# Patient Record
Sex: Male | Born: 1940 | Race: Black or African American | Hispanic: No | Smoking: Former smoker
Health system: Southern US, Community
[De-identification: ages and names within clinical notes are randomized; demographics above are authoritative.]

## PROBLEM LIST (undated history)

## (undated) DIAGNOSIS — E119 Type 2 diabetes mellitus without complications: Secondary | ICD-10-CM

## (undated) DIAGNOSIS — I10 Essential (primary) hypertension: Secondary | ICD-10-CM

## (undated) DIAGNOSIS — I499 Cardiac arrhythmia, unspecified: Secondary | ICD-10-CM

## (undated) DIAGNOSIS — I2510 Atherosclerotic heart disease of native coronary artery without angina pectoris: Secondary | ICD-10-CM

## (undated) HISTORY — PX: CARDIAC SURGERY: SHX584

---

## 1993-06-16 HISTORY — PX: CORONARY ANGIOPLASTY WITH STENT PLACEMENT: SHX49

## 2013-08-19 ENCOUNTER — Inpatient Hospital Stay: Payer: Self-pay | Admitting: Cardiology

## 2013-08-19 LAB — APTT: Activated PTT: 27.1 secs (ref 23.6–35.9)

## 2013-08-19 LAB — BASIC METABOLIC PANEL
Anion Gap: 9 (ref 7–16)
BUN: 18 mg/dL (ref 7–18)
Calcium, Total: 9.3 mg/dL (ref 8.5–10.1)
Chloride: 100 mmol/L (ref 98–107)
Co2: 25 mmol/L (ref 21–32)
Creatinine: 1.13 mg/dL (ref 0.60–1.30)
Glucose: 261 mg/dL — ABNORMAL HIGH (ref 65–99)
Osmolality: 279 (ref 275–301)
POTASSIUM: 4.6 mmol/L (ref 3.5–5.1)
Sodium: 134 mmol/L — ABNORMAL LOW (ref 136–145)

## 2013-08-19 LAB — TROPONIN I: TROPONIN-I: 0.31 ng/mL — AB

## 2013-08-19 LAB — CBC WITH DIFFERENTIAL/PLATELET
BASOS PCT: 0.5 %
Basophil #: 0.1 10*3/uL (ref 0.0–0.1)
EOS ABS: 0 10*3/uL (ref 0.0–0.7)
EOS PCT: 0.2 %
HCT: 42.7 % (ref 40.0–52.0)
HGB: 13.7 g/dL (ref 13.0–18.0)
LYMPHS ABS: 0.9 10*3/uL — AB (ref 1.0–3.6)
LYMPHS PCT: 6.7 %
MCH: 27.4 pg (ref 26.0–34.0)
MCHC: 32 g/dL (ref 32.0–36.0)
MCV: 86 fL (ref 80–100)
MONO ABS: 0.5 x10 3/mm (ref 0.2–1.0)
Monocyte %: 3.6 %
NEUTROS PCT: 89 %
Neutrophil #: 12 10*3/uL — ABNORMAL HIGH (ref 1.4–6.5)
Platelet: 263 10*3/uL (ref 150–440)
RBC: 4.99 10*6/uL (ref 4.40–5.90)
RDW: 14.1 % (ref 11.5–14.5)
WBC: 13.5 10*3/uL — ABNORMAL HIGH (ref 3.8–10.6)

## 2013-08-19 LAB — PROTIME-INR
INR: 0.9
Prothrombin Time: 12 secs (ref 11.5–14.7)

## 2013-08-20 LAB — BASIC METABOLIC PANEL
Anion Gap: 8 (ref 7–16)
BUN: 14 mg/dL (ref 7–18)
Calcium, Total: 8.8 mg/dL (ref 8.5–10.1)
Chloride: 101 mmol/L (ref 98–107)
Co2: 27 mmol/L (ref 21–32)
Creatinine: 1.07 mg/dL (ref 0.60–1.30)
EGFR (African American): >60
EGFR (Non-African Amer.): >60
Glucose: 165 mg/dL — ABNORMAL HIGH (ref 65–99)
Osmolality: 276 (ref 275–301)
Potassium: 3.3 mmol/L — ABNORMAL LOW (ref 3.5–5.1)
Sodium: 136 mmol/L (ref 136–145)

## 2013-08-20 LAB — TROPONIN I: Troponin-I: >40 ng/mL

## 2013-08-20 LAB — CK-MB
CK-MB: 78.5 ng/mL — ABNORMAL HIGH (ref 0.5–3.6)
CK-MB: 46.2 ng/mL — ABNORMAL HIGH (ref 0.5–3.6)

## 2013-08-21 LAB — BASIC METABOLIC PANEL
Anion Gap: 5 — ABNORMAL LOW (ref 7–16)
BUN: 14 mg/dL (ref 7–18)
CALCIUM: 8.6 mg/dL (ref 8.5–10.1)
CHLORIDE: 104 mmol/L (ref 98–107)
CREATININE: 1.14 mg/dL (ref 0.60–1.30)
Co2: 28 mmol/L (ref 21–32)
EGFR (Non-African Amer.): >60
Glucose: 137 mg/dL — ABNORMAL HIGH (ref 65–99)
OSMOLALITY: 276 (ref 275–301)
Potassium: 3.2 mmol/L — ABNORMAL LOW (ref 3.5–5.1)
SODIUM: 137 mmol/L (ref 136–145)

## 2014-10-07 NOTE — Discharge Summary (Signed)
PATIENT NAME:  Christopher Alvarado, [name] MR#:  [id number] DATE OF BIRTH:  May 21, 1941  DATE OF ADMISSION:  08/19/2013 DATE OF DISCHARGE:  08/21/2013  PRIMARY CARE PHYSICIAN: No local MD.  CHIEF COMPLAINT: Chest pain.   HISTORY OF PRESENT ILLNESS: Please see admission H and P.   HOSPITAL COURSE: The patient presented to [facility] Emergency Room on 08/19/2013 with 8 out of 10 chest pain. EKG revealed evidence for acute inferior wall ST elevation myocardial infarction. The patient went directly to cardiac catheterization, where coronary arteriography revealed occluded distal right coronary artery. The patient underwent percutaneous coronary intervention, receiving a 2.75 x 15 mm drug-eluting stent with an excellent angiographic result. The patient was returned to the CCU, where he had an uncomplicated hospital stay without recurrent chest pain. The patient ruled in for myocardial infarction by CPK, isoenzymes and troponin. The patient was transferred to telemetry on 08/20/2013. The patient resumed home medications, which include atenolol and lovastatin. The patient was started on lisinopril 5 mg daily. On the morning of 08/21/2013, the patient was ambulating without difficulty and was discharged home in stable condition. He is scheduled to see me in followup in 1 to 2 weeks.   ____________________________ Marcina MillardAlexander Karianne Nogueira, MD ap:jcm D: 08/21/2013 09:19:51 ET T: 08/21/2013 19:15:39 ET JOB#: [id number]  cc: Marcina MillardAlexander Abiola Behring, MD, <Dictator> Marcina MillardALEXANDER Dabney Dever MD ELECTRONICALLY SIGNED 09/06/2013 12:43

## 2014-10-07 NOTE — H&P (Signed)
PATIENT NAME:  Christopher Alvarado, [name] MR#:  [id number] DATE OF BIRTH:  1940-11-08  CARDIOLOGY CONSULT  DATE OF ADMISSION:  08/19/2013  PRIMARY CARE PHYSICIAN: [facility].   REASON FOR ADMISSION: Chest pain, acute inferior ST-elevation myocardial infarction.   HISTORY OF PRESENT ILLNESS: The patient is a 74 year old gentleman with known history of coronary artery disease with prior MI. The patient reports he was in his usual state of health when he developed substernal chest discomfort described as 8/10 at approximately 9:00 a.m. this morning. The patient waited till after 12:00 noon and presented to [facility] Emergency Room where he was still experiencing 6/10 chest pain. EKG was performed which revealed ST elevations in the inferior leads.   PAST MEDICAL HISTORY:  1.  Status post prior MI.  2.  Diabetes.  3.  Hypertension.  4.  Hyperlipidemia.   MEDICATIONS: Lovastatin, atenolol, amlodipine, metformin, and Lantus insulin 50 units at bedtime.   SOCIAL HISTORY: The patient is a widower. His wife recently died. He has 1 child. He has remote tobacco abuse history.   FAMILY HISTORY: Positive for coronary artery disease.   REVIEW OF SYSTEMS:  CONSTITUTIONAL: No fever or chills.  EYES: No blurry vision.  EARS: No hearing loss.  RESPIRATORY: No shortness of breath.  CARDIOVASCULAR: Chest pain as described above.  GASTROINTESTINAL: No nausea, vomiting, or diarrhea.  GENITOURINARY: No dysuria or hematuria.  ENDOCRINE: No polyuria or polydipsia.  MUSCULOSKELETAL: No arthralgias or myalgias.  NEUROLOGICAL: No focal muscle weakness or numbness.  PSYCHOLOGICAL: No depression or anxiety.   PHYSICAL EXAMINATION:  VITAL SIGNS: Blood pressure was 130/80, pulse 60 to 70.  HEENT: Pupils equal and reactive to light and accommodation.  NECK: Supple without thyromegaly.  LUNGS: Clear.  HEART: Normal JVP. Normal PMI. Regular rate and rhythm. Normal S1, S2. No appreciable gallop, murmur, or rub.   ABDOMEN: Soft and nontender. Pulses were intact bilaterally.  MUSCULOSKELETAL: Normal muscle tone.  NEUROLOGIC: Alert and oriented x3. Motor and sensory both grossly intact.   IMPRESSION: A 74 year old gentleman, with known history of coronary artery disease, who presents with prolonged episode of chest pain. EKG diagnostic of acute inferior-ST elevation myocardial infarction.   RECOMMENDATIONS: Proceed to cardiac catheterization and primary PCI. The risks, benefits, and alternatives were explained and informed written consent obtained.   ____________________________ Christopher MillardAlexander Sandi Towe, MD ap:np D: 08/19/2013 15:57:39 ET T: 08/19/2013 16:46:45 ET JOB#: [id number]  cc: Christopher MillardAlexander Kamran Coker, MD, <Dictator> Christopher MillardALEXANDER Jibreel Fedewa MD ELECTRONICALLY SIGNED 09/06/2013 12:42

## 2015-09-02 ENCOUNTER — Ambulatory Visit (INDEPENDENT_AMBULATORY_CARE_PROVIDER_SITE_OTHER)
Admission: EM | Admit: 2015-09-02 | Discharge: 2015-09-02 | Disposition: A | Discharge status: Other Acute Inpatient Hospital | Payer: Medicare Other | Source: Home / Self Care | Attending: Family Medicine | Admitting: Family Medicine

## 2015-09-02 ENCOUNTER — Other Ambulatory Visit: Payer: Self-pay

## 2015-09-02 ENCOUNTER — Encounter: Payer: Self-pay | Admitting: *Deleted

## 2015-09-02 ENCOUNTER — Emergency Department: Payer: Medicare Other

## 2015-09-02 ENCOUNTER — Encounter: Payer: Self-pay | Admitting: Emergency Medicine

## 2015-09-02 ENCOUNTER — Emergency Department
Admission: EM | Admit: 2015-09-02 | Discharge: 2015-09-02 | Disposition: A | Discharge status: Home / Self Care | Payer: Medicare Other | Attending: Emergency Medicine | Admitting: Emergency Medicine

## 2015-09-02 DIAGNOSIS — Z955 Presence of coronary angioplasty implant and graft: Secondary | ICD-10-CM

## 2015-09-02 DIAGNOSIS — R531 Weakness: Secondary | ICD-10-CM | POA: Diagnosis not present

## 2015-09-02 DIAGNOSIS — Z7982 Long term (current) use of aspirin: Secondary | ICD-10-CM

## 2015-09-02 DIAGNOSIS — I2510 Atherosclerotic heart disease of native coronary artery without angina pectoris: Secondary | ICD-10-CM | POA: Insufficient documentation

## 2015-09-02 DIAGNOSIS — E118 Type 2 diabetes mellitus with unspecified complications: Secondary | ICD-10-CM

## 2015-09-02 DIAGNOSIS — I10 Essential (primary) hypertension: Secondary | ICD-10-CM

## 2015-09-02 DIAGNOSIS — Z79899 Other long term (current) drug therapy: Secondary | ICD-10-CM

## 2015-09-02 DIAGNOSIS — M6289 Other specified disorders of muscle: Secondary | ICD-10-CM | POA: Diagnosis not present

## 2015-09-02 DIAGNOSIS — Z87891 Personal history of nicotine dependence: Secondary | ICD-10-CM

## 2015-09-02 DIAGNOSIS — Z7984 Long term (current) use of oral hypoglycemic drugs: Secondary | ICD-10-CM | POA: Insufficient documentation

## 2015-09-02 DIAGNOSIS — Z794 Long term (current) use of insulin: Secondary | ICD-10-CM | POA: Insufficient documentation

## 2015-09-02 DIAGNOSIS — E119 Type 2 diabetes mellitus without complications: Secondary | ICD-10-CM | POA: Insufficient documentation

## 2015-09-02 DIAGNOSIS — Z880 Allergy status to penicillin: Secondary | ICD-10-CM

## 2015-09-02 DIAGNOSIS — R42 Dizziness and giddiness: Secondary | ICD-10-CM | POA: Insufficient documentation

## 2015-09-02 DIAGNOSIS — E1165 Type 2 diabetes mellitus with hyperglycemia: Secondary | ICD-10-CM | POA: Insufficient documentation

## 2015-09-02 HISTORY — DX: Essential (primary) hypertension: I10

## 2015-09-02 HISTORY — DX: Type 2 diabetes mellitus without complications: E11.9

## 2015-09-02 LAB — URINALYSIS COMPLETE WITH MICROSCOPIC (ARMC ONLY)
Bilirubin Urine: NEGATIVE
Glucose, UA: >500 mg/dL — AB
HGB URINE DIPSTICK: NEGATIVE
Ketones, ur: NEGATIVE mg/dL
LEUKOCYTES UA: NEGATIVE
NITRITE: NEGATIVE
PROTEIN: NEGATIVE mg/dL
SPECIFIC GRAVITY, URINE: 1.005 (ref 1.005–1.030)
pH: 5 (ref 5.0–8.0)

## 2015-09-02 LAB — COMPREHENSIVE METABOLIC PANEL
ALBUMIN: 4.1 g/dL (ref 3.5–5.0)
ALK PHOS: 75 U/L (ref 38–126)
ALT: 13 U/L — ABNORMAL LOW (ref 17–63)
ANION GAP: 9 (ref 5–15)
AST: 20 U/L (ref 15–41)
BUN: 13 mg/dL (ref 6–20)
CALCIUM: 9.2 mg/dL (ref 8.9–10.3)
CO2: 24 mmol/L (ref 22–32)
Chloride: 102 mmol/L (ref 101–111)
Creatinine, Ser: 0.93 mg/dL (ref 0.61–1.24)
GFR calc Af Amer: >60 mL/min (ref >60)
GFR calc non Af Amer: >60 mL/min (ref >60)
GLUCOSE: 222 mg/dL — AB (ref 65–99)
Potassium: 3.7 mmol/L (ref 3.5–5.1)
SODIUM: 135 mmol/L (ref 135–145)
Total Bilirubin: 1 mg/dL (ref 0.3–1.2)
Total Protein: 8 g/dL (ref 6.5–8.1)

## 2015-09-02 LAB — CBC
HEMATOCRIT: 40.1 % (ref 40.0–52.0)
HEMOGLOBIN: 13.2 g/dL (ref 13.0–18.0)
MCH: 27.7 pg (ref 26.0–34.0)
MCHC: 32.8 g/dL (ref 32.0–36.0)
MCV: 84.5 fL (ref 80.0–100.0)
Platelets: 270 10*3/uL (ref 150–440)
RBC: 4.74 MIL/uL (ref 4.40–5.90)
RDW: 14.9 % — ABNORMAL HIGH (ref 11.5–14.5)
WBC: 11.6 10*3/uL — AB (ref 3.8–10.6)

## 2015-09-02 LAB — TROPONIN I: Troponin I: <0.03 ng/mL (ref <0.031)

## 2015-09-02 MED ORDER — SODIUM CHLORIDE 0.9 % IV SOLN
1000.0000 mL | Freq: Once | INTRAVENOUS | Status: AC
  Administered 2015-09-02: 1000 mL via INTRAVENOUS

## 2015-09-02 NOTE — ED Notes (Signed)
BG 208

## 2015-09-02 NOTE — ED Provider Notes (Signed)
CSN: [id number]     Arrival date & time 09/02/15  1249 History   None    Chief Complaint  Patient presents with  . 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 Denies any fevers, chills, cough, chest pains, shortness of breath, vision changes, numbness/tingling. States fell off his bed last night but denies hitting his head.   Patient is a 75 y.o. male presenting with dizziness. The history is provided by the patient.  Dizziness   Past Medical History  Diagnosis Date  . Diabetes mellitus without complication (HCC)   . Hypertension    Past Surgical History  Procedure Laterality Date  . Cardiac surgery     No family history on file. Social History  Substance Use Topics  . Smoking status: Former Games developermoker  . Smokeless tobacco: None  . Alcohol Use: No    Review of Systems  Neurological: Positive for dizziness.    Allergies  Penicillins  Home Medications   Prior to Admission medications   Medication Sig Start Date End Date Taking? Authorizing Provider  acetaminophen (ACETAMINOPHEN 8 HOUR) 650 MG CR tablet Take 650 mg by mouth every 8 (eight) hours as needed for pain.   Yes Historical Provider, MD  amLODipine (NORVASC) 10 MG tablet Take 10 mg by mouth daily.   Yes Historical Provider, MD  aspirin 81 MG tablet Take 81 mg by mouth daily.   Yes Historical Provider, MD  carvedilol (COREG) 25 MG tablet Take 12.5 mg by mouth 2 (two) times daily with a meal.   Yes Historical Provider, MD  furosemide (LASIX) 40 MG tablet Take 40 mg by mouth.   Yes Historical Provider, MD  gabapentin (NEURONTIN) 300 MG capsule Take 300 mg by mouth daily.   Yes  Historical Provider, MD  Insulin NPH Isophane & Regular (HUMULIN 70/30 [place]) Inject into the skin.   Yes Historical Provider, MD  losartan (COZAAR) 25 MG tablet Take 25 mg by mouth daily.   Yes Historical Provider, MD  metFORMIN (GLUCOPHAGE) 1000 MG tablet Take 1,000 mg by mouth 2 (two) times daily with a meal.   Yes Historical Provider, MD  potassium chloride SA (K-DUR,KLOR-CON) 20 MEQ tablet Take 20 mEq by mouth 2 (two) times daily.   Yes Historical Provider, MD  ticagrelor (BRILINTA) 90 MG TABS tablet Take 90 mg by mouth 2 (two) times daily.   Yes Historical Provider, MD   Meds Ordered and Administered this Visit  Medications - No data to display  BP 149/70 mmHg  Pulse 85  Temp(Src) 97.9 F (36.6 C) (Oral)  Ht 5\' 11"  (1.803 m)  Wt 240 lb (108.863 kg)  BMI 33.49 kg/m2  SpO2 100% No data found.   Physical Exam  Constitutional: He is oriented to person, place, and time. He appears well-developed and well-nourished. No distress.  HENT:  Head: Normocephalic and atraumatic.  Right Ear: Tympanic membrane, external ear and ear canal normal.  Left Ear: Tympanic membrane, external ear and ear canal normal.  Nose: Nose normal.  Mouth/Throat: Uvula is midline, oropharynx is clear and moist and mucous membranes are normal. No oropharyngeal exudate or tonsillar abscesses.  Eyes: Conjunctivae and EOM are normal. Pupils are equal, round, and reactive to light. Right eye exhibits no discharge. Left eye  exhibits no discharge. No scleral icterus.  Neck: Normal range of motion. Neck supple. No tracheal deviation present. No thyromegaly present.  Cardiovascular: Normal rate, regular rhythm and normal heart sounds.   Pulmonary/Chest: Effort normal and breath sounds normal. No stridor. No respiratory distress. He has no wheezes. He has no rales. He exhibits no tenderness.  Lymphadenopathy:    He has no cervical adenopathy.  Neurological: He is alert and oriented to person, place, and time. He has normal  reflexes. He displays normal reflexes. No cranial nerve deficit.  Mild right upper extremity decreased strength 3-4/5  Skin: Skin is warm and dry. No rash noted. He is not diaphoretic.  Nursing note and vitals reviewed.   ED Course  Procedures (including critical care time)  Labs Review Labs Reviewed  CBG MONITORING, ED    Imaging Review No results found.   Visual Acuity Review  Right Eye Distance:   Left Eye Distance:   Bilateral Distance:    Right Eye Near:   Left Eye Near:    Bilateral Near:        EKG: Normal sinus rhythm; RBBB; inferior infarct, age undetermined; change from previous tracing; reviewed by me and agree   Random blood glucose: 208  MDM   1. Right sided weakness   2. CAD in native artery   3. Type 2 diabetes mellitus with complication, with long-term current use of insulin (HCC)   4. Generalized weakness   5. Dizziness    Discussed with patient and wife, that due to patient's high risk chronic medical problems and current symptoms, would recommend transport to emergency department by EMS for further evaluation and management; patient transported from clinic in stable condition by EMS; charge RN at [facility] ED notified   Payton Mccallum, MD 09/02/15 1352

## 2015-09-02 NOTE — ED Provider Notes (Signed)
[facility] Emergency Department Provider Note  ____________________________________________    I have reviewed the triage vital signs and the nursing notes.   HISTORY  Chief Complaint Weakness    HPI Christopher Alvarado is a 75 y.o. male who presents with complaints of mild dizziness over the last several days. He reports occasionally when he is walking he feels lightheaded. He denies chest pain. No palpitations. No recent travel. No calf pain or swelling. No fevers or chills. He does have a history of diabetes. Patient reports he has been urinating frequently but denies incontinence to me. He reports that sugar was 208 at urgent care. No nausea or vomiting or abdominal pain     Past Medical History  Diagnosis Date  . Diabetes mellitus without complication (HCC)   . Hypertension     There are no active problems to display for this patient.   Past Surgical History  Procedure Laterality Date  . Cardiac surgery      Current Outpatient Rx  Name  Route  Sig  Dispense  Refill  . acetaminophen (ACETAMINOPHEN 8 HOUR) 650 MG CR tablet   Oral   Take 650 mg by mouth every 8 (eight) hours as needed for pain.         Marland Kitchen. amLODipine (NORVASC) 10 MG tablet   Oral   Take 10 mg by mouth daily.         Marland Kitchen. aspirin 81 MG tablet   Oral   Take 81 mg by mouth daily.         . carvedilol (COREG) 25 MG tablet   Oral   Take 12.5 mg by mouth 2 (two) times daily with a meal.         . furosemide (LASIX) 40 MG tablet   Oral   Take 40 mg by mouth.         . gabapentin (NEURONTIN) 300 MG capsule   Oral   Take 300 mg by mouth daily.         . Insulin NPH Isophane & Regular (HUMULIN 70/30 [place])   Subcutaneous   Inject into the skin.         Marland Kitchen. losartan (COZAAR) 25 MG tablet   Oral   Take 25 mg by mouth daily.         . metFORMIN (GLUCOPHAGE) 1000 MG tablet   Oral   Take 1,000 mg by mouth 2 (two) times daily with a meal.         . potassium  chloride SA (K-DUR,KLOR-CON) 20 MEQ tablet   Oral   Take 20 mEq by mouth 2 (two) times daily.         . ticagrelor (BRILINTA) 90 MG TABS tablet   Oral   Take 90 mg by mouth 2 (two) times daily.           Allergies Penicillins  No family history on file.  Social History Social History  Substance Use Topics  . Smoking status: Former Games developermoker  . Smokeless tobacco: None  . Alcohol Use: No    Review of Systems  Constitutional: Negative for fever. Eyes: Negative for redness ENT: Negative for sore throat Cardiovascular: Negative for chest pain Respiratory: Negative for shortness of breath. Gastrointestinal: Negative for abdominal pain Genitourinary: Negative for dysuria. Polyuria Musculoskeletal: Negative for back pain. Skin: Negative for rash. Neurological: Negative for focal weakness. Dizziness as above Psychiatric: no anxiety    ____________________________________________   PHYSICAL EXAM:  VITAL SIGNS: ED Triage  Vitals  Enc Vitals Group     BP 09/02/15 1410 167/80 mmHg     Pulse Rate 09/02/15 1408 76     Resp 09/02/15 1408 18     Temp 09/02/15 1408 98.4 F (36.9 C)     Temp Source 09/02/15 1408 Oral     SpO2 09/02/15 1408 98 %     Weight 09/02/15 1408 240 lb (108.863 kg)     Height 09/02/15 1408  (1.803 m)     Head Cir --      Peak Flow --      Pain Score 09/02/15 1410 0     Pain Loc --      Pain Edu? --      Excl. in GC? --     Constitutional: Alert and oriented. Well appearing and in no distress.  Eyes: Conjunctivae are normal. No erythema or injection ENT   Head: Normocephalic and atraumatic.   Mouth/Throat: Mucous membranes are moist. Cardiovascular: Normal rate, regular rhythm. Normal and symmetric distal pulses are present in the upper extremities.  Respiratory: Normal respiratory effort without tachypnea nor retractions. Breath sounds are clear and equal bilaterally.  Gastrointestinal: Soft and non-tender in all quadrants. No  distention. There is no CVA tenderness. Genitourinary: deferred Musculoskeletal: Nontender with normal range of motion in all extremities. No lower extremity tenderness nor edema. Neurologic:  Normal speech and language. No gross focal neurologic deficits are appreciated. Skin:  Skin is warm, dry and intact. No rash noted. Psychiatric: Mood and affect are normal. Patient exhibits appropriate insight and judgment.  ____________________________________________    LABS (pertinent positives/negatives)  Labs Reviewed  CBC - Abnormal; Notable for the following:    WBC 11.6 (*)    RDW 14.9 (*)    All other components within normal limits  COMPREHENSIVE METABOLIC PANEL - Abnormal; Notable for the following:    Glucose, Bld 222 (*)    ALT 13 (*)    All other components within normal limits  URINALYSIS COMPLETEWITH MICROSCOPIC ([facility] ONLY) - Abnormal; Notable for the following:    Color, Urine COLORLESS (*)    APPearance CLEAR (*)    Glucose, UA >500 (*)    Bacteria, UA RARE (*)    Squamous Epithelial / LPF 0-5 (*)    All other components within normal limits  TROPONIN I    ____________________________________________   EKG  ED ECG REPORT I, Jene Every, the attending physician, personally viewed and interpreted this ECG.   Date: 09/02/2015  EKG Time: 2:10 PM  Rate: 78  Rhythm: normal sinus rhythm  Axis: Normal axis  Intervals:right bundle branch block  ST&T Change: Nonspecific   ____________________________________________    RADIOLOGY  Chest x-ray unremarkable  ____________________________________________   PROCEDURES  Procedure(s) performed: none  Critical Care performed: none  ____________________________________________   INITIAL IMPRESSION / ASSESSMENT AND PLAN / ED COURSE  Pertinent labs & imaging results that were available during my care of the patient were reviewed by me and considered in my medical decision making (see chart for  details).  Patient well-appearing and in no distress. Sent from urgent care for evaluation due to periodic dizziness. History of present illness not consistent with ACS, PE, dissection. Possible dehydration. We'll check labs, chest x-ray give IV fluids and reevaluate.  ____________________________________________   FINAL CLINICAL IMPRESSION(S) / ED DIAGNOSES  Dizziness       Jene Every, MD 09/02/15 1510

## 2015-09-02 NOTE — ED Notes (Signed)
Pt states that he has been weak and dizzy for about 3 days, and frequent urination.

## 2015-09-02 NOTE — Discharge Instructions (Signed)

## 2015-09-02 NOTE — ED Provider Notes (Signed)
Signed out from Dr. Cyril LoosenKinner to reevaluate after 1 L of fluids. Plan also to recheck vital signs. Patient originally sent from an urgent care to evaluate his dizziness. ----------------------------------------- 5:43 PM on 09/02/2015 -----------------------------------------  Physical Exam  BP 151/74 mmHg  Pulse 65  Temp(Src) 98.4 F (36.9 C) (Oral)  Resp 17  Ht 5\' 11"  (1.803 m)  Wt 240 lb (108.863 kg)  BMI 33.49 kg/m2  SpO2 98%  Physical Exam Resting comfortable at this time. No distress. ED Course  Procedures  MDM Patient received 1 L of fluids. No orthostatic changes status post fluid administration. Says still feeling mild dizziness/lightheadedness when standing. No acute findings here in the emergency department. Will follow with primary care doctor.      Myrna Blazeravid Matthew Abran Gavigan, MD 09/02/15 1745

## 2015-09-02 NOTE — ED Notes (Signed)
[facility] for weakness, dizziness, and incontinence for the past 3 days. Patient denies unilateral numbness or weakness.

## 2015-09-03 LAB — GLUCOSE, CAPILLARY: Glucose-Capillary: 208 mg/dL — ABNORMAL HIGH (ref 65–99)

## 2015-09-09 ENCOUNTER — Encounter: Payer: Self-pay | Admitting: Emergency Medicine

## 2015-09-09 ENCOUNTER — Emergency Department
Admission: EM | Admit: 2015-09-09 | Discharge: 2015-09-09 | Disposition: A | Discharge status: Home / Self Care | Payer: Medicare Other | Attending: Emergency Medicine | Admitting: Emergency Medicine

## 2015-09-09 ENCOUNTER — Emergency Department: Payer: Medicare Other

## 2015-09-09 DIAGNOSIS — S065X9A Traumatic subdural hemorrhage with loss of consciousness of unspecified duration, initial encounter: Secondary | ICD-10-CM

## 2015-09-09 DIAGNOSIS — Y939 Activity, unspecified: Secondary | ICD-10-CM | POA: Insufficient documentation

## 2015-09-09 DIAGNOSIS — Z87891 Personal history of nicotine dependence: Secondary | ICD-10-CM | POA: Diagnosis not present

## 2015-09-09 DIAGNOSIS — S80212A Abrasion, left knee, initial encounter: Secondary | ICD-10-CM | POA: Insufficient documentation

## 2015-09-09 DIAGNOSIS — S065X0A Traumatic subdural hemorrhage without loss of consciousness, initial encounter: Secondary | ICD-10-CM | POA: Insufficient documentation

## 2015-09-09 DIAGNOSIS — Z794 Long term (current) use of insulin: Secondary | ICD-10-CM | POA: Insufficient documentation

## 2015-09-09 DIAGNOSIS — N390 Urinary tract infection, site not specified: Secondary | ICD-10-CM

## 2015-09-09 DIAGNOSIS — Y929 Unspecified place or not applicable: Secondary | ICD-10-CM | POA: Insufficient documentation

## 2015-09-09 DIAGNOSIS — S062X0S Diffuse traumatic brain injury without loss of consciousness, sequela: Secondary | ICD-10-CM

## 2015-09-09 DIAGNOSIS — I10 Essential (primary) hypertension: Secondary | ICD-10-CM | POA: Insufficient documentation

## 2015-09-09 DIAGNOSIS — Z7982 Long term (current) use of aspirin: Secondary | ICD-10-CM | POA: Insufficient documentation

## 2015-09-09 DIAGNOSIS — Z7984 Long term (current) use of oral hypoglycemic drugs: Secondary | ICD-10-CM | POA: Diagnosis not present

## 2015-09-09 DIAGNOSIS — E119 Type 2 diabetes mellitus without complications: Secondary | ICD-10-CM | POA: Insufficient documentation

## 2015-09-09 DIAGNOSIS — Y999 Unspecified external cause status: Secondary | ICD-10-CM | POA: Insufficient documentation

## 2015-09-09 DIAGNOSIS — W06XXXA Fall from bed, initial encounter: Secondary | ICD-10-CM | POA: Insufficient documentation

## 2015-09-09 DIAGNOSIS — G939 Disorder of brain, unspecified: Secondary | ICD-10-CM

## 2015-09-09 DIAGNOSIS — S065XAA Traumatic subdural hemorrhage with loss of consciousness status unknown, initial encounter: Secondary | ICD-10-CM

## 2015-09-09 LAB — CBC
HEMATOCRIT: 37.4 % — AB (ref 40.0–52.0)
HEMOGLOBIN: 12.3 g/dL — AB (ref 13.0–18.0)
MCH: 27.8 pg (ref 26.0–34.0)
MCHC: 32.9 g/dL (ref 32.0–36.0)
MCV: 84.3 fL (ref 80.0–100.0)
Platelets: 248 10*3/uL (ref 150–440)
RBC: 4.44 MIL/uL (ref 4.40–5.90)
RDW: 15.2 % — AB (ref 11.5–14.5)
WBC: 8.2 10*3/uL (ref 3.8–10.6)

## 2015-09-09 LAB — URINALYSIS COMPLETE WITH MICROSCOPIC (ARMC ONLY)
BILIRUBIN URINE: NEGATIVE
Glucose, UA: >500 mg/dL — AB
NITRITE: NEGATIVE
PH: 6 (ref 5.0–8.0)
Protein, ur: 100 mg/dL — AB
Specific Gravity, Urine: 1.037 — ABNORMAL HIGH (ref 1.005–1.030)

## 2015-09-09 LAB — BASIC METABOLIC PANEL
ANION GAP: 6 (ref 5–15)
BUN: 13 mg/dL (ref 6–20)
CALCIUM: 8.7 mg/dL — AB (ref 8.9–10.3)
CHLORIDE: 103 mmol/L (ref 101–111)
CO2: 26 mmol/L (ref 22–32)
Creatinine, Ser: 1.11 mg/dL (ref 0.61–1.24)
GFR calc non Af Amer: >60 mL/min (ref >60)
Glucose, Bld: 315 mg/dL — ABNORMAL HIGH (ref 65–99)
Potassium: 3.3 mmol/L — ABNORMAL LOW (ref 3.5–5.1)
SODIUM: 135 mmol/L (ref 135–145)

## 2015-09-09 LAB — PROTIME-INR
INR: 1.04
PROTHROMBIN TIME: 13.8 s (ref 11.4–15.0)

## 2015-09-09 LAB — GLUCOSE, CAPILLARY: GLUCOSE-CAPILLARY: 319 mg/dL — AB (ref 65–99)

## 2015-09-09 LAB — APTT: APTT: 27 s (ref 24–36)

## 2015-09-09 MED ORDER — CIPROFLOXACIN IN D5W 400 MG/200ML IV SOLN
400.0000 mg | Freq: Once | INTRAVENOUS | Status: AC
  Administered 2015-09-09: 400 mg via INTRAVENOUS
  Filled 2015-09-09: qty 200

## 2015-09-09 MED ORDER — SODIUM CHLORIDE 0.9 % IV BOLUS (SEPSIS)
1000.0000 mL | Freq: Once | INTRAVENOUS | Status: AC
  Administered 2015-09-09: 1000 mL via INTRAVENOUS

## 2015-09-09 MED ORDER — LABETALOL HCL 5 MG/ML IV SOLN
10.0000 mg | Freq: Once | INTRAVENOUS | Status: AC
  Administered 2015-09-09: 10 mg via INTRAVENOUS
  Filled 2015-09-09: qty 4

## 2015-09-09 NOTE — ED Notes (Signed)
Patient transported to CT 

## 2015-09-09 NOTE — ED Notes (Signed)
Pt reports mechanical fall getting out of bed. Small abrasion to left shin. Pt reports he did hit head but denies any pain at all or LOC.  Has stopped taking long acting insulin since they switched in from lantus.

## 2015-09-09 NOTE — ED Notes (Signed)
Dr Sharma Covertnorman notified of bp

## 2015-09-09 NOTE — ED Provider Notes (Addendum)
[facility] Emergency Department Provider Note  ____________________________________________  Time seen: Approximately 7:29 AM  I have reviewed the triage vital signs and the nursing notes.   HISTORY  Chief Complaint Fall    HPI Christopher Alvarado is a 75 y.o. male with a history of DM, HTN, presenting with fall. Patient reports that 4 days ago, his primary care physician changed his insulin regimen. 3 days ago he was placed on antibiotics for a UTI. Since his insulin regimen has changed, the patient reports that he has had a sensation of being unsteady when he walks. He has been borrowing his friend's walker. 3 nights ago, he sustained a fall where he slid off the edge of the bed when trying to get up to go to the bathroom in the middle the night. He did not lose consciousness or injure himself, nor did he have any chest pain, shortness of breath, palpitations, lightheadedness. At 5:30 this morning, the same thing happened where he got up and the edge of the bed and just slid off onto the ground resulting in the left knee abrasion but no loss of consciousness, he did not hit his head. He also had no preceding symptoms today. He has not been checking his blood sugars when he feels unsteady on his feet. He reports eating and drinking normally, no fevers or chills.   Past Medical History  Diagnosis Date  . Diabetes mellitus without complication (HCC)   . Hypertension     There are no active problems to display for this patient.   Past Surgical History  Procedure Laterality Date  . Cardiac surgery      Current Outpatient Rx  Name  Route  Sig  Dispense  Refill  . acetaminophen (ACETAMINOPHEN 8 HOUR) 650 MG CR tablet   Oral   Take 650 mg by mouth every 8 (eight) hours as needed for pain.         Marland Kitchen amLODipine (NORVASC) 10 MG tablet   Oral   Take 10 mg by mouth daily.         Marland Kitchen aspirin 81 MG tablet   Oral   Take 81 mg by mouth daily.         .  carvedilol (COREG) 25 MG tablet   Oral   Take 12.5 mg by mouth 2 (two) times daily with a meal.         . furosemide (LASIX) 40 MG tablet   Oral   Take 40 mg by mouth.         . gabapentin (NEURONTIN) 300 MG capsule   Oral   Take 300 mg by mouth daily.         . Insulin NPH Isophane & Regular (HUMULIN 70/30 [place])   Subcutaneous   Inject into the skin.         Marland Kitchen losartan (COZAAR) 25 MG tablet   Oral   Take 25 mg by mouth daily.         . metFORMIN (GLUCOPHAGE) 1000 MG tablet   Oral   Take 1,000 mg by mouth 2 (two) times daily with a meal.         . potassium chloride SA (K-DUR,KLOR-CON) 20 MEQ tablet   Oral   Take 20 mEq by mouth 2 (two) times daily.         . ticagrelor (BRILINTA) 90 MG TABS tablet   Oral   Take 90 mg by mouth 2 (two) times daily.  Allergies Penicillins  History reviewed. No pertinent family history.  Social History Social History  Substance Use Topics  . Smoking status: Former Games developer  . Smokeless tobacco: None  . Alcohol Use: No    Review of Systems Constitutional: No fever/chills. No lightheadedness. No syncope. Eyes: No visual changes. ENT: No sore throat. Cardiovascular: Denies chest pain, palpitations. Respiratory: Denies shortness of breath.  No cough. Gastrointestinal: No abdominal pain.  No nausea, no vomiting.  No diarrhea.  No constipation. Genitourinary: Negative for dysuria. Musculoskeletal: Negative for back pain. Skin: Negative for rash. Positive abrasion to the left knee. Neurological: Negative for headaches, focal weakness or numbness. No tingling. Positive gait and balance. No visual changes, speech changes or changes in mentation.  10-point ROS otherwise negative.  ____________________________________________   PHYSICAL EXAM:  VITAL SIGNS: ED Triage Vitals  Enc Vitals Group     BP 09/09/15 0635 189/82 mmHg     Pulse Rate 09/09/15 0635 76     Resp 09/09/15 0635 16     Temp 09/09/15 0635  98.4 F (36.9 C)     Temp src --      SpO2 09/09/15 0635 96 %     Weight 09/09/15 0635 245 lb (111.131 kg)     Height 09/09/15 0635  (1.803 m)     Head Cir --      Peak Flow --      Pain Score 09/09/15 0638 2     Pain Loc --      Pain Edu? --      Excl. in GC? --     Constitutional: Patient is alert and oriented and answering questions appropriately. He is chronically ill appearing but in no acute distress at this time.  Eyes: Conjunctivae are normal.  EOMI. PERRLA. No scleral icterus. Head: Atraumatic. No raccoon eyes or Battle sign. Nose: No congestion/rhinnorhea. Mouth/Throat: Mucous membranes are moist. No malocclusion or dental injury. Neck: No stridor.  Supple.  Full range of motion without pain. No midline C-spine tenderness to palpation, step-offs or deformities. Cardiovascular: Normal rate, regular rhythm. No murmurs, rubs or gallops.  Respiratory: Normal respiratory effort.  No retractions. Lungs CTAB.  No wheezes, rales or ronchi. Gastrointestinal: Obese, soft, mildly distended. Nontender. No guarding or rebound. No peritoneal signs. Musculoskeletal: Minimal nonpitting lower extremity edema to the mid tibial shaft. No palpable cords or tenderness to palpation in the calves. Full range of motion of the bilateral hips knees and ankles without pain. Neurologic: Alert and oriented 3. Speech is clear.  Face and smile symmetric. Tongue is midline. EOMI and PERRLA. No nystagmus. During pronator drift testing, the patient has 2 cm of upward drift in the left arm 5 out of 5 grip, biceps, triceps, hip flexors, plantar flexion and dorsiflexion. Normal sensation to light touch in the bilateral upper and lower extremities, and face. Normal heel-to-shin.  Skin:  Skin is warm, dry. 1 x 1" circular abrasion over the left knee just inferior to the patella without any knee effusion. Psychiatric: Mood and affect are normal. Speech and behavior are normal.  Normal  judgement.  ____________________________________________   LABS (all labs ordered are listed, but only abnormal results are displayed)  Labs Reviewed  GLUCOSE, CAPILLARY - Abnormal; Notable for the following:    Glucose-Capillary 319 (*)    All other components within normal limits  CBC - Abnormal; Notable for the following:    Hemoglobin 12.3 (*)    HCT 37.4 (*)    RDW 15.2 (*)  All other components within normal limits  BASIC METABOLIC PANEL - Abnormal; Notable for the following:    Potassium 3.3 (*)    Glucose, Bld 315 (*)    Calcium 8.7 (*)    All other components within normal limits  URINALYSIS COMPLETEWITH MICROSCOPIC ([facility] ONLY) - Abnormal; Notable for the following:    Color, Urine YELLOW (*)    APPearance CLEAR (*)    Glucose, UA >500 (*)    Ketones, ur TRACE (*)    Specific Gravity, Urine 1.037 (*)    Hgb urine dipstick 1+ (*)    Protein, ur 100 (*)    Leukocytes, UA TRACE (*)    Bacteria, UA RARE (*)    Squamous Epithelial / LPF 0-5 (*)    All other components within normal limits  URINE CULTURE   ____________________________________________  EKG  ED ECG REPORT I, Rockne MenghiniNorman, Anne-Caroline, the attending physician, personally viewed and interpreted this ECG.   Date: 09/09/2015  EKG Time: 633  Rate: 73  Rhythm: normal sinus rhythm  Axis: Leftward  Intervals: Shortened PR interval  ST&T Change: No ST elevation; Possible incomplete right bundle-branch block. EKG is compared to 3/6 and 3/19 and overall the morphology is grossly unchanged with the exception of that directionality of the QRS in V1.   ____________________________________________  RADIOLOGY  Ct Head Wo Contrast  09/09/2015  CLINICAL DATA:  Pt states weakness and dizziness x 1 week. Pt states that he fell a week ago, pt also states he fell out of bed this am, states he hit his head but denies LOC. Pt denies hx of stroke, seizure, or ca. EXAM: CT HEAD WITHOUT CONTRAST TECHNIQUE: Contiguous  axial images were obtained from the base of the skull through the vertex without intravenous contrast. COMPARISON:  None. FINDINGS: There is a large mixed attenuation extra-axial collection over the RIGHT convexity, greater than 3.3 cm in thickness as seen on image 21 series 2. The more dependent collection is isodense with brain, 40-50 Hounsfield units, and the more superior collection is definitely hypodense but not to the degree of cerebrospinal fluid. No parenchymal hemorrhage. 10 mm of RIGHT-to-LEFT shift. No similar collections on the LEFT. No parenchymal hemorrhage. No skull fracture. No sinus air-fluid level. Generalized atrophy. No hydrocephalus. 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 Significant RIGHT-to-LEFT shift of 1 cm. Neurosurgical evacuation is warranted. Critical Value/emergent results were called by telephone at the time of interpretation on 09/09/2015 at 8:37 am to Dr. Rockne MenghiniANNE-CAROLINE Elizabeht Suto , who verbally acknowledged these results. Electronically Signed   By: Elsie StainJohn T Curnes M.D.   On: 09/09/2015 08:37    ____________________________________________   PROCEDURES  Procedure(s) performed: None  Critical Care performed: Yes ____________________________________________   INITIAL IMPRESSION / ASSESSMENT AND PLAN / ED COURSE  Pertinent labs & imaging results that were available during my care of the patient were reviewed by me and considered in my medical decision making (see chart for details).  75 y.o. male under treatment for urinary tract infection, with new insulin regimen, presenting with 2 episodes of falling from the bed with no reported significant injury, as well as new unsteadiness on his feet. My neurologic exam is grossly normal except for the mild upward drift in the left arm during her nadir testing but the patient has full strength in the left upper extremity. I will get a CT scan to evaluate for stroke although the etiology of  his symptoms is likely to be due to this.  We will also repeat a urine to see if he is responsive to his antibiotic regimen. He is hyperglycemic here and we will get labs to rule out DKA. I will also give him some IV fluids and we will test his gait when we have more people to help as he is morbidly obese and we will keep him safe.  ----------------------------------------- 9:18 AM on 09/09/2015 -----------------------------------------  The patient has a CT scan that shows a chronic subdural on the right side with a subacute component that is 3 cm with 2 cm of midline shift. I went back to reevaluate the patient who continues to be stable and states that he is not having a headache or any new symptoms at this time. He denies any falls other than the 2 falls described in my history of present illness. I have contacted the [facility] transfer center and spoken with Dr. Samuella Cota, neurosurgeon on-call, who recommends ER to ER transfer. I am awaiting a call from the ER physician. At this time, the patient is not having any severe neurologic deficit nor any seizure so antiepileptics are not indicated. He does have a history of taking aspirin but I would not be able to get platelets here and he is not having any acute bleeding.  We will continue to monitor him closely while he is in the emergency department here.  The patient also still has some leukocyte esterase and white blood cells in his urine to have treated him with Rocephin.  CRITICAL CARE Performed by: Rockne Menghini   Total critical care time: 35 minutes  Critical care time was exclusive of separately billable procedures and treating other patients.  Critical care was necessary to treat or prevent imminent or life-threatening deterioration.  Critical care was time spent personally by me on the following activities: development of treatment plan with patient and/or surrogate as well as nursing, discussions with consultants, evaluation of patient's  response to treatment, examination of patient, obtaining history from patient or surrogate, ordering and performing treatments and interventions, ordering and review of laboratory studies, ordering and review of radiographic studies, pulse oximetry and re-evaluation of patient's condition.   ____________________________________________  FINAL CLINICAL IMPRESSION(S) / ED DIAGNOSES  Final diagnoses:  Subdural hematoma (HCC)  Midline shift of brain due to hematoma  UTI (lower urinary tract infection)      NEW MEDICATIONS STARTED DURING THIS VISIT:  New Prescriptions   No medications on file     Rockne Menghini, MD 09/09/15 1610  Rockne Menghini, MD 09/09/15 [phone]

## 2015-09-09 NOTE — ED Notes (Signed)
Pt given portable phone to call family 

## 2015-09-09 NOTE — ED Notes (Signed)
Pt. Slid out of bed this morning at around 5:30.  Pt. Has a small abrasion on lt. Knee.  Pt. States he hit head on end table.  No visible laceration noted, pt. Denies LOC.  Pt. States he stopped taking insulin, because pt. States "It caused my UTI"  Pt. Currently being treated for uti.  Pt. States he is taking his metformin.

## 2015-09-09 NOTE — ED Notes (Signed)
Report to [facility]. [facility]

## 2015-09-10 LAB — URINE CULTURE

## 2016-04-26 ENCOUNTER — Emergency Department: Payer: Medicare Other

## 2016-04-26 ENCOUNTER — Emergency Department
Admission: EM | Admit: 2016-04-26 | Discharge: 2016-04-26 | Payer: Medicare Other | Attending: Emergency Medicine | Admitting: Emergency Medicine

## 2016-04-26 ENCOUNTER — Encounter: Payer: Self-pay | Admitting: Emergency Medicine

## 2016-04-26 DIAGNOSIS — Z955 Presence of coronary angioplasty implant and graft: Secondary | ICD-10-CM | POA: Diagnosis not present

## 2016-04-26 DIAGNOSIS — Z79899 Other long term (current) drug therapy: Secondary | ICD-10-CM | POA: Diagnosis not present

## 2016-04-26 DIAGNOSIS — Z794 Long term (current) use of insulin: Secondary | ICD-10-CM | POA: Insufficient documentation

## 2016-04-26 DIAGNOSIS — Z87891 Personal history of nicotine dependence: Secondary | ICD-10-CM | POA: Insufficient documentation

## 2016-04-26 DIAGNOSIS — Z7982 Long term (current) use of aspirin: Secondary | ICD-10-CM | POA: Insufficient documentation

## 2016-04-26 DIAGNOSIS — I10 Essential (primary) hypertension: Secondary | ICD-10-CM | POA: Insufficient documentation

## 2016-04-26 DIAGNOSIS — Z7984 Long term (current) use of oral hypoglycemic drugs: Secondary | ICD-10-CM | POA: Insufficient documentation

## 2016-04-26 DIAGNOSIS — R4182 Altered mental status, unspecified: Secondary | ICD-10-CM | POA: Diagnosis present

## 2016-04-26 DIAGNOSIS — E119 Type 2 diabetes mellitus without complications: Secondary | ICD-10-CM | POA: Diagnosis not present

## 2016-04-26 DIAGNOSIS — I639 Cerebral infarction, unspecified: Secondary | ICD-10-CM | POA: Diagnosis not present

## 2016-04-26 HISTORY — DX: Cardiac arrhythmia, unspecified: I49.9

## 2016-04-26 LAB — URINALYSIS COMPLETE WITH MICROSCOPIC (ARMC ONLY)
BACTERIA UA: NONE SEEN
BILIRUBIN URINE: NEGATIVE
GLUCOSE, UA: NEGATIVE mg/dL
Leukocytes, UA: NEGATIVE
Nitrite: NEGATIVE
PH: 5 (ref 5.0–8.0)
Protein, ur: 100 mg/dL — AB
Specific Gravity, Urine: 1.018 (ref 1.005–1.030)

## 2016-04-26 LAB — COMPREHENSIVE METABOLIC PANEL
ALBUMIN: 4 g/dL (ref 3.5–5.0)
ALK PHOS: 69 U/L (ref 38–126)
ALT: 17 U/L (ref 17–63)
AST: 24 U/L (ref 15–41)
Anion gap: 9 (ref 5–15)
BILIRUBIN TOTAL: 0.9 mg/dL (ref 0.3–1.2)
BUN: 17 mg/dL (ref 6–20)
CALCIUM: 9.5 mg/dL (ref 8.9–10.3)
CO2: 24 mmol/L (ref 22–32)
CREATININE: 1.16 mg/dL (ref 0.61–1.24)
Chloride: 105 mmol/L (ref 101–111)
GFR calc Af Amer: >60 mL/min (ref >60)
GFR calc non Af Amer: 60 mL/min — ABNORMAL LOW (ref >60)
GLUCOSE: 78 mg/dL (ref 65–99)
Potassium: 3.6 mmol/L (ref 3.5–5.1)
Sodium: 138 mmol/L (ref 135–145)
TOTAL PROTEIN: 7.5 g/dL (ref 6.5–8.1)

## 2016-04-26 LAB — CBC
HCT: 37.5 % — ABNORMAL LOW (ref 40.0–52.0)
Hemoglobin: 12.2 g/dL — ABNORMAL LOW (ref 13.0–18.0)
MCH: 27.5 pg (ref 26.0–34.0)
MCHC: 32.6 g/dL (ref 32.0–36.0)
MCV: 84.4 fL (ref 80.0–100.0)
Platelets: 218 10*3/uL (ref 150–440)
RBC: 4.44 MIL/uL (ref 4.40–5.90)
RDW: 17.9 % — AB (ref 11.5–14.5)
WBC: 9.9 10*3/uL (ref 3.8–10.6)

## 2016-04-26 LAB — LACTIC ACID, PLASMA: LACTIC ACID, VENOUS: 1.8 mmol/L (ref 0.5–1.9)

## 2016-04-26 LAB — TROPONIN I

## 2016-04-26 LAB — BRAIN NATRIURETIC PEPTIDE: B NATRIURETIC PEPTIDE 5: 435 pg/mL — AB (ref 0.0–100.0)

## 2016-04-26 MED ORDER — DILTIAZEM HCL 25 MG/5ML IV SOLN
10.0000 mg | Freq: Once | INTRAVENOUS | Status: AC
  Administered 2016-04-26: 10 mg via INTRAVENOUS
  Filled 2016-04-26: qty 5

## 2016-04-26 NOTE — ED Provider Notes (Addendum)
[facility] Emergency Department Provider Note   L5 caveat: Review of systems and history is limited by altered mental status.     Time seen: ----------------------------------------- 3:31 PM on 04/26/2016 -----------------------------------------    I have reviewed the triage vital signs and the nursing notes.   HISTORY  Chief Complaint Altered Mental Status    HPI Christopher Alvarado is a 75 y.o. male who presents to ER being brought from home by EMS. Neighbor called 911 stating he was not acting normally. Patient turned the bathroom sink on and forgot it was on. Patient was disoriented on arrival, states he has had a cough but otherwise denies complaints at this time.   Past Medical History:  Diagnosis Date  . Diabetes mellitus without complication (HCC)   . Hypertension   . Irregular heartbeat     There are no active problems to display for this patient.   Past Surgical History:  Procedure Laterality Date  . CARDIAC SURGERY    . CORONARY ANGIOPLASTY WITH STENT PLACEMENT  1995    Allergies Penicillins  Social History Social History  Substance Use Topics  . Smoking status: Former Games developermoker  . Smokeless tobacco: Never Used  . Alcohol use No    Review of Systems Positive for cough, otherwise unknown ____________________________________________   PHYSICAL EXAM:  VITAL SIGNS: ED Triage Vitals  Enc Vitals Group     BP 04/26/16 1426 138/81     Pulse Rate 04/26/16 1426 96     Resp 04/26/16 1426 (!) 21     Temp 04/26/16 1426 97.7 F (36.5 C)     Temp Source 04/26/16 1426 Oral     SpO2 04/26/16 1426 95 %     Weight 04/26/16 1427 230 lb (104.3 kg)     Height 04/26/16 1427 5\' 11"  (1.803 m)     Head Circumference --      Peak Flow --      Pain Score --      Pain Loc --      Pain Edu? --      Excl. in GC? --     Constitutional: Alert But disoriented, no distress Eyes: Conjunctivae are normal.  Normal extraocular  movements. ENT   Head: Normocephalic and atraumatic.   Nose: No congestion/rhinnorhea.   Mouth/Throat: Mucous membranes are moist, patient is holding mucus and secretions in and around the mouth   Neck: No stridor. Cardiovascular: Rapid rate, irregular rhythm. No murmurs, rubs, or gallops. Respiratory: Normal respiratory effort without tachypnea nor retractions. Breath sounds are clear and equal bilaterally. No wheezes/rales/rhonchi. Gastrointestinal: Soft and nontender. Normal bowel sounds Musculoskeletal: Nontender with normal range of motion in all extremities. Bilateral lower extremity edema Neurologic:  Weakness, left facial droop is noted, decreased sensation left arm Skin:  Skin is warm, dry and intact. No rash noted. Psychiatric: Mood and affect are normal. Speech and behavior are normal.  ____________________________________________  EKG: Interpreted by me. Atrial fibrillation with a rate of 122 bpm, wide QRS, long QT, right bundle branch block.  ____________________________________________  ED COURSE:  Pertinent labs & imaging results that were available during my care of the patient were reviewed by me and considered in my medical decision making (see chart for details). Clinical Course   Patient presents to the ER for altered mental status. We will assess with labs and imaging.  Procedures ____________________________________________   LABS (pertinent positives/negatives)  Labs Reviewed  COMPREHENSIVE METABOLIC PANEL - Abnormal; Notable for the following:  Result Value   GFR calc non Af Amer 60 (*)    All other components within normal limits  CBC - Abnormal; Notable for the following:    Hemoglobin 12.2 (*)    HCT 37.5 (*)    RDW 17.9 (*)    All other components within normal limits  TROPONIN I  LACTIC ACID, PLASMA  URINALYSIS COMPLETEWITH MICROSCOPIC ([facility] ONLY)   CRITICAL CARE Performed by: Emily FilbertWilliams, Quamere Mussell E   Total critical care  time: 30 minutes  Critical care time was exclusive of separately billable procedures and treating other patients.  Critical care was necessary to treat or prevent imminent or life-threatening deterioration.  Critical care was time spent personally by me on the following activities: development of treatment plan with patient and/or surrogate as well as nursing, discussions with consultants, evaluation of patient's response to treatment, examination of patient, obtaining history from patient or surrogate, ordering and performing treatments and interventions, ordering and review of laboratory studies, ordering and review of radiographic studies, pulse oximetry and re-evaluation of patient's condition.  RADIOLOGY Images were viewed by me  Chest x-ray CT head IMPRESSION: 1. The right subdural hematoma seen previously has been largely evacuated. A small remaining subdural hematoma is seen anteriorly measuring up to 5 mm in thickness. There is some mild increased attenuation within this small remaining component suggesting a small component of acute subdural blood products. 2. Right parietal infarct, new in the interval, thought to be acute or subacute. 3. Left occipital infarct, new in the interval. A small amount of higher attenuation centrally within the left occipital infarct could represent a small amount of remaining cortex versus some subtle blood products. An MRI could further assess as clinically warranted. IMPRESSION: Bibasilar airspace disease greater on the RIGHT is concerning for multifocal pneumonia versus pulmonary edema.   The findings were called to Dr. Daryel NovemberJonathan Sahory Nordling. ____________________________________________  FINAL ASSESSMENT AND PLAN  Altered mental status, CVA  Plan: Patient with labs and imaging as dictated above. Patient presents to the ER for altered mental status. 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 [facility] this year. I have repeatedly discussed the need for him to stay in the hospital for further evaluation, to have an MRI and thorough workup. For hours the patient declined admission and transfer, subsequently family and friends came and reiterated his need for admission. Patient reluctantly agreed to hospitalization.   Emily FilbertWilliams, Gracelin Weisberg E, MD   Note: This dictation was prepared with Dragon dictation. Any transcriptional errors that result from this process are unintentional    Emily FilbertJonathan E Kaleyah Labreck, MD 04/26/16 1710    Emily FilbertJonathan E Malahki Gasaway, MD 04/26/16 1715    Emily FilbertJonathan E Bodey Frizell, MD 04/26/16 1845    Emily FilbertJonathan E Lundynn Cohoon, MD 04/26/16 2046    Emily FilbertJonathan E Romeo Zielinski, MD 04/26/16 2100    Emily FilbertJonathan E Angelee Bahr, MD 04/26/16 2239

## 2016-04-26 NOTE — ED Notes (Signed)
Friend arrived to pt to go home [facility]

## 2016-04-26 NOTE — ED Notes (Signed)
Pt accepted at [facility]. Awaiting transport

## 2016-04-26 NOTE — ED Notes (Signed)
Pt decided he would allow us to transfer him to [facility] for neuro.

## 2016-04-26 NOTE — ED Notes (Signed)
[facility] transfer center was called to initiate transfer for Neurosurgery per Dr. Mayford KnifeWilliams request @ [phone].  I spoke with Drue StagerMoe at the transfer center.

## 2016-04-26 NOTE — ED Triage Notes (Signed)
Pt presents to ED via [facility] from home c/o [facility]. Pt's neighbor called 911 stating pt was acting abnormally (e.g. turning bathroom sink on and forgetting it's on). Hx DM and HTN, CBG 101 per EMS. Pt A&Ox3, disoriented to situation. States EMS brought him to ED for irregular heartbeat.

## 2016-04-26 NOTE — ED Notes (Signed)
Spoke, with pt's permission, to Christopher Alvarado (his friend) and let her know he was leaving [facility]. Advised we would be sending him by ambulance and wanted to make sure he had someone there to let him in the house. The friend states they will come and pick him up and have the ability to get him into the house. Pt made aware and is agreeable.

## 2016-04-26 NOTE — ED Notes (Signed)
[facility] on scene for transport to [facility]

## 2016-04-26 NOTE — ED Notes (Signed)
Patient accepted to [facility], going to room 2733, [facility] has been called for transport at 2209.

## 2016-05-10 ENCOUNTER — Emergency Department
Admission: EM | Admit: 2016-05-10 | Discharge: 2016-05-10 | Payer: Medicare Other | Attending: Emergency Medicine | Admitting: Emergency Medicine

## 2016-05-10 DIAGNOSIS — Z434 Encounter for attention to other artificial openings of digestive tract: Secondary | ICD-10-CM

## 2016-05-10 DIAGNOSIS — Z955 Presence of coronary angioplasty implant and graft: Secondary | ICD-10-CM | POA: Insufficient documentation

## 2016-05-10 DIAGNOSIS — E119 Type 2 diabetes mellitus without complications: Secondary | ICD-10-CM | POA: Insufficient documentation

## 2016-05-10 DIAGNOSIS — Z794 Long term (current) use of insulin: Secondary | ICD-10-CM | POA: Diagnosis not present

## 2016-05-10 DIAGNOSIS — Z8673 Personal history of transient ischemic attack (TIA), and cerebral infarction without residual deficits: Secondary | ICD-10-CM | POA: Diagnosis not present

## 2016-05-10 DIAGNOSIS — I482 Chronic atrial fibrillation, unspecified: Secondary | ICD-10-CM

## 2016-05-10 DIAGNOSIS — Z7982 Long term (current) use of aspirin: Secondary | ICD-10-CM | POA: Insufficient documentation

## 2016-05-10 DIAGNOSIS — K9423 Gastrostomy malfunction: Secondary | ICD-10-CM | POA: Insufficient documentation

## 2016-05-10 DIAGNOSIS — Z87891 Personal history of nicotine dependence: Secondary | ICD-10-CM | POA: Insufficient documentation

## 2016-05-10 DIAGNOSIS — I10 Essential (primary) hypertension: Secondary | ICD-10-CM | POA: Insufficient documentation

## 2016-05-10 DIAGNOSIS — Z79899 Other long term (current) drug therapy: Secondary | ICD-10-CM | POA: Insufficient documentation

## 2016-05-10 DIAGNOSIS — T85528A Displacement of other gastrointestinal prosthetic devices, implants and grafts, initial encounter: Secondary | ICD-10-CM

## 2016-05-10 LAB — CBC WITH DIFFERENTIAL/PLATELET
BASOS ABS: 0 10*3/uL (ref 0–0.1)
Basophils Relative: 0 %
EOS PCT: 2 %
Eosinophils Absolute: 0.3 10*3/uL (ref 0–0.7)
HEMATOCRIT: 41.5 % (ref 40.0–52.0)
Hemoglobin: 13.4 g/dL (ref 13.0–18.0)
LYMPHS ABS: 1.3 10*3/uL (ref 1.0–3.6)
LYMPHS PCT: 9 %
MCH: 27.2 pg (ref 26.0–34.0)
MCHC: 32.4 g/dL (ref 32.0–36.0)
MCV: 84.1 fL (ref 80.0–100.0)
MONO ABS: 1 10*3/uL (ref 0.2–1.0)
MONOS PCT: 6 %
NEUTROS ABS: 12.6 10*3/uL — AB (ref 1.4–6.5)
Neutrophils Relative %: 83 %
PLATELETS: 243 10*3/uL (ref 150–440)
RBC: 4.94 MIL/uL (ref 4.40–5.90)
RDW: 16.6 % — AB (ref 11.5–14.5)
WBC: 15.2 10*3/uL — ABNORMAL HIGH (ref 3.8–10.6)

## 2016-05-10 LAB — BASIC METABOLIC PANEL
Anion gap: 6 (ref 5–15)
BUN: 27 mg/dL — AB (ref 6–20)
CHLORIDE: 112 mmol/L — AB (ref 101–111)
CO2: 28 mmol/L (ref 22–32)
Calcium: 9.1 mg/dL (ref 8.9–10.3)
Creatinine, Ser: 1.1 mg/dL (ref 0.61–1.24)
GFR calc Af Amer: >60 mL/min (ref >60)
GFR calc non Af Amer: >60 mL/min (ref >60)
Glucose, Bld: 148 mg/dL — ABNORMAL HIGH (ref 65–99)
POTASSIUM: 3.8 mmol/L (ref 3.5–5.1)
SODIUM: 146 mmol/L — AB (ref 135–145)

## 2016-05-10 LAB — PROTIME-INR
INR: 1.4
Prothrombin Time: 17.3 seconds — ABNORMAL HIGH (ref 11.4–15.2)

## 2016-05-10 LAB — TROPONIN I

## 2016-05-10 MED ORDER — DILTIAZEM HCL 25 MG/5ML IV SOLN
10.0000 mg | Freq: Once | INTRAVENOUS | Status: AC
  Administered 2016-05-10: 10 mg via INTRAVENOUS
  Filled 2016-05-10: qty 5

## 2016-05-10 NOTE — ED Provider Notes (Signed)
[facility] Emergency Department Provider Note   ____________________________________________   First MD Initiated Contact with Patient 05/10/16 0532     (approximate)  I have reviewed the triage vital signs and the nursing notes.   HISTORY  Chief Complaint feeding tube displacement  History limited by prior CVA  HPI Christopher Alvarado is a 75 y.o. male brought to the ED from SNF with a chief complaint of dislodged G-tube. Patient is unable to give history secondary to recent CVA. Recent history obtained via patient's medical records via Care Everywhere. [facility] week hospitalization for ischemic stroke. History of atrial fibrillation on warfarin. Records reveal patient had endoscopic placement of G-tube on 11/21. Nursing staff found tube dislodged this morning when they rounded on patient.   Past Medical History:  Diagnosis Date  . Diabetes mellitus without complication (HCC)   . Hypertension   . Irregular heartbeat     There are no active problems to display for this patient.   Past Surgical History:  Procedure Laterality Date  . CARDIAC SURGERY    . CORONARY ANGIOPLASTY WITH STENT PLACEMENT  1995    Prior to Admission medications   Medication Sig Start Date End Date Taking? Authorizing Provider  acetaminophen (ACETAMINOPHEN 8 HOUR) 650 MG CR tablet Take 650 mg by mouth every 8 (eight) hours as needed for pain.    Historical Provider, MD  amLODipine (NORVASC) 10 MG tablet Take 10 mg by mouth daily.    Historical Provider, MD  aspirin 81 MG tablet Take 81 mg by mouth daily.    Historical Provider, MD  carvedilol (COREG) 25 MG tablet Take 12.5 mg by mouth 2 (two) times daily with a meal.    Historical Provider, MD  furosemide (LASIX) 40 MG tablet Take 40 mg by mouth.    Historical Provider, MD  gabapentin (NEURONTIN) 300 MG capsule Take 300 mg by mouth daily.    Historical Provider, MD  Insulin NPH Isophane &  Regular (HUMULIN 70/30 [place]) Inject into the skin.    Historical Provider, MD  losartan (COZAAR) 25 MG tablet Take 25 mg by mouth daily.    Historical Provider, MD  metFORMIN (GLUCOPHAGE) 1000 MG tablet Take 1,000 mg by mouth 2 (two) times daily with a meal.    Historical Provider, MD  potassium chloride SA (K-DUR,KLOR-CON) 20 MEQ tablet Take 20 mEq by mouth 2 (two) times daily.    Historical Provider, MD  ticagrelor (BRILINTA) 90 MG TABS tablet Take 90 mg by mouth 2 (two) times daily.    Historical Provider, MD    Allergies Penicillins  No family history on file.  Social History Social History  Substance Use Topics  . Smoking status: Former Games developermoker  . Smokeless tobacco: Never Used  . Alcohol use No    Review of Systems  Constitutional: No fever/chills. Eyes: No visual changes. ENT: No sore throat. Cardiovascular: Denies chest pain. Respiratory: Denies shortness of breath. Gastrointestinal: Positive for G-tube dislodgment. No abdominal pain.  No nausea, no vomiting.  No diarrhea.  No constipation. Genitourinary: Negative for dysuria. Musculoskeletal: Negative for back pain. Skin: Negative for rash. Neurological: Negative for headaches, focal weakness or numbness.  10-point ROS otherwise negative.  ____________________________________________   PHYSICAL EXAM:  VITAL SIGNS: ED Triage Vitals  Enc Vitals Group     BP 05/10/16 0445 136/84     Pulse Rate 05/10/16 0445 (!) 108     Resp 05/10/16 0445 18  Temp 05/10/16 0445 97.7 F (36.5 C)     Temp Source 05/10/16 0445 Oral     SpO2 05/10/16 0445 98 %     Weight 05/10/16 0446 230 lb (104.3 kg)     Height 05/10/16 0446 6\' 2"  (1.88 m)     Head Circumference --      Peak Flow --      Pain Score 05/10/16 0446 0     Pain Loc --      Pain Edu? --      Excl. in GC? --     Constitutional: Alert. Chronically ill appearing and in no acute distress. Eyes: Conjunctivae are normal. PERRL. EOMI. Head: Atraumatic. Nose: No  congestion/rhinnorhea. Mouth/Throat: Mucous membranes are moist.  Oropharynx non-erythematous. Neck: No stridor.   Cardiovascular: Normal rate, regular rhythm. Grossly normal heart sounds.  Good peripheral circulation. Respiratory: Normal respiratory effort.  No retractions. Lungs CTAB. Gastrointestinal: Small, immature stoma. No active bleeding. Soft and nontender. No distention. No abdominal bruits. No CVA tenderness. Musculoskeletal: No lower extremity tenderness nor edema.  No joint effusions. Neurologic:  Moans at baseline. MAEx4. Severe dysarthria.  Skin:  Skin is warm, dry and intact. No rash noted. Psychiatric: Unable to assess. ____________________________________________   LABS (all labs ordered are listed, but only abnormal results are displayed)  Labs Reviewed - No data to display ____________________________________________  EKG  ED ECG REPORT I, Liviya Santini J, the attending physician, personally viewed and interpreted this ECG.   Date: 05/10/2016  EKG Time: 0449  Rate: 123  Rhythm: normal EKG, normal sinus rhythm, unchanged from previous tracings, atrial fibrillation, rate 123  Axis: Normal  Intervals:none  ST&T Change: Nonspecific  ____________________________________________  RADIOLOGY  None ____________________________________________   PROCEDURES  Procedure(s) performed: None  Procedures  Critical Care performed: No  ____________________________________________   INITIAL IMPRESSION / ASSESSMENT AND PLAN / ED COURSE  Pertinent labs & imaging results that were available during my care of the patient were reviewed by me and considered in my medical decision making (see chart for details).  75 year old male with multiple comorbidities who presents with dislodgment of G-tube. Review of records reveals that patient's G-tube was endosc[facility] only [facility] hospitalized for the past 2 weeks. Given patient's recent surgery and  fresh, immature stoma, will contact [facility] transfer center [facility]. Heart rate bouncing between 105 to 120s in atrial fibrillation. [facility]  Clinical Course      ____________________________________________   FINAL CLINICAL IMPRESSION(S) / ED DIAGNOSES  Final diagnoses:  Gastrojejunostomy tube dislodgement (HCC)  Chronic atrial fibrillation (HCC)      NEW MEDICATIONS STARTED DURING THIS VISIT:  New Prescriptions   No medications on file     Note:  This document was prepared using Dragon voice recognition software and may include unintentional dictation errors.    Irean HongJade J Edithe Dobbin, MD 05/10/16 [phone]

## 2016-05-10 NOTE — ED Triage Notes (Signed)
Pt arrives to ED via [facility] from [facility] with c/o feeding tube displacement.

## 2016-05-10 NOTE — ED Notes (Signed)
Attempted to suction patient out, but patient unable to follow command to not bite down on the yaunker. Pt occasionally moaning and wanting water to drink, explained to him that he cannot have anything to drink at this time.

## 2016-05-10 NOTE — ED Notes (Signed)
[facility] notified that the patient would be transferred [facility] department.

## 2016-10-06 ENCOUNTER — Inpatient Hospital Stay
Admission: EM | Admit: 2016-10-06 | Discharge: 2016-10-08 | DRG: 689 | Disposition: A | Discharge status: Home Health | Payer: Medicare Other | Attending: Internal Medicine | Admitting: Internal Medicine

## 2016-10-06 ENCOUNTER — Encounter: Payer: Self-pay | Admitting: Emergency Medicine

## 2016-10-06 DIAGNOSIS — F0281 Dementia in other diseases classified elsewhere with behavioral disturbance: Secondary | ICD-10-CM | POA: Diagnosis present

## 2016-10-06 DIAGNOSIS — I4891 Unspecified atrial fibrillation: Secondary | ICD-10-CM

## 2016-10-06 DIAGNOSIS — E119 Type 2 diabetes mellitus without complications: Secondary | ICD-10-CM

## 2016-10-06 DIAGNOSIS — Z86718 Personal history of other venous thrombosis and embolism: Secondary | ICD-10-CM

## 2016-10-06 DIAGNOSIS — F0390 Unspecified dementia without behavioral disturbance: Secondary | ICD-10-CM

## 2016-10-06 DIAGNOSIS — Z87891 Personal history of nicotine dependence: Secondary | ICD-10-CM

## 2016-10-06 DIAGNOSIS — Z8249 Family history of ischemic heart disease and other diseases of the circulatory system: Secondary | ICD-10-CM | POA: Diagnosis not present

## 2016-10-06 DIAGNOSIS — Z7902 Long term (current) use of antithrombotics/antiplatelets: Secondary | ICD-10-CM | POA: Diagnosis not present

## 2016-10-06 DIAGNOSIS — Z955 Presence of coronary angioplasty implant and graft: Secondary | ICD-10-CM | POA: Diagnosis not present

## 2016-10-06 DIAGNOSIS — F03A0 Unspecified dementia, mild, without behavioral disturbance, psychotic disturbance, mood disturbance, and anxiety: Secondary | ICD-10-CM

## 2016-10-06 DIAGNOSIS — R1310 Dysphagia, unspecified: Secondary | ICD-10-CM | POA: Diagnosis present

## 2016-10-06 DIAGNOSIS — Z79899 Other long term (current) drug therapy: Secondary | ICD-10-CM | POA: Diagnosis not present

## 2016-10-06 DIAGNOSIS — Z9114 Patient's other noncompliance with medication regimen: Secondary | ICD-10-CM

## 2016-10-06 DIAGNOSIS — E860 Dehydration: Secondary | ICD-10-CM | POA: Diagnosis present

## 2016-10-06 DIAGNOSIS — G9349 Other encephalopathy: Secondary | ICD-10-CM | POA: Diagnosis present

## 2016-10-06 DIAGNOSIS — N390 Urinary tract infection, site not specified: Principal | ICD-10-CM

## 2016-10-06 DIAGNOSIS — Z880 Allergy status to penicillin: Secondary | ICD-10-CM | POA: Diagnosis not present

## 2016-10-06 DIAGNOSIS — G9340 Encephalopathy, unspecified: Secondary | ICD-10-CM | POA: Diagnosis present

## 2016-10-06 DIAGNOSIS — R410 Disorientation, unspecified: Secondary | ICD-10-CM | POA: Diagnosis present

## 2016-10-06 DIAGNOSIS — G309 Alzheimer's disease, unspecified: Secondary | ICD-10-CM | POA: Diagnosis present

## 2016-10-06 DIAGNOSIS — N179 Acute kidney failure, unspecified: Secondary | ICD-10-CM

## 2016-10-06 DIAGNOSIS — Z794 Long term (current) use of insulin: Secondary | ICD-10-CM

## 2016-10-06 DIAGNOSIS — Z823 Family history of stroke: Secondary | ICD-10-CM | POA: Diagnosis not present

## 2016-10-06 DIAGNOSIS — I2510 Atherosclerotic heart disease of native coronary artery without angina pectoris: Secondary | ICD-10-CM | POA: Diagnosis present

## 2016-10-06 DIAGNOSIS — Z931 Gastrostomy status: Secondary | ICD-10-CM

## 2016-10-06 DIAGNOSIS — I482 Chronic atrial fibrillation: Secondary | ICD-10-CM | POA: Diagnosis present

## 2016-10-06 DIAGNOSIS — I10 Essential (primary) hypertension: Secondary | ICD-10-CM | POA: Diagnosis present

## 2016-10-06 HISTORY — DX: Atherosclerotic heart disease of native coronary artery without angina pectoris: I25.10

## 2016-10-06 LAB — URINALYSIS, COMPLETE (UACMP) WITH MICROSCOPIC
BILIRUBIN URINE: NEGATIVE
Glucose, UA: 150 mg/dL — AB
Ketones, ur: 20 mg/dL — AB
Nitrite: NEGATIVE
PROTEIN: 100 mg/dL — AB
SPECIFIC GRAVITY, URINE: 1.012 (ref 1.005–1.030)
SQUAMOUS EPITHELIAL / LPF: NONE SEEN
pH: 6 (ref 5.0–8.0)

## 2016-10-06 LAB — CBC WITH DIFFERENTIAL/PLATELET
BASOS ABS: 0 10*3/uL (ref 0–0.1)
Basophils Relative: 0 %
EOS PCT: 1 %
Eosinophils Absolute: 0.1 10*3/uL (ref 0–0.7)
HEMATOCRIT: 41.8 % (ref 40.0–52.0)
Hemoglobin: 13.8 g/dL (ref 13.0–18.0)
LYMPHS ABS: 1.1 10*3/uL (ref 1.0–3.6)
LYMPHS PCT: 12 %
MCH: 27.4 pg (ref 26.0–34.0)
MCHC: 33.1 g/dL (ref 32.0–36.0)
MCV: 82.7 fL (ref 80.0–100.0)
MONO ABS: 0.7 10*3/uL (ref 0.2–1.0)
MONOS PCT: 8 %
Neutro Abs: 7 10*3/uL — ABNORMAL HIGH (ref 1.4–6.5)
Neutrophils Relative %: 79 %
PLATELETS: 216 10*3/uL (ref 150–440)
RBC: 5.06 MIL/uL (ref 4.40–5.90)
RDW: 16.8 % — AB (ref 11.5–14.5)
WBC: 8.9 10*3/uL (ref 3.8–10.6)

## 2016-10-06 LAB — GLUCOSE, CAPILLARY
GLUCOSE-CAPILLARY: 251 mg/dL — AB (ref 65–99)
GLUCOSE-CAPILLARY: 222 mg/dL — AB (ref 65–99)

## 2016-10-06 LAB — BASIC METABOLIC PANEL
ANION GAP: 14 (ref 5–15)
BUN: 20 mg/dL (ref 6–20)
CHLORIDE: 90 mmol/L — AB (ref 101–111)
CO2: 26 mmol/L (ref 22–32)
Calcium: 9 mg/dL (ref 8.9–10.3)
Creatinine, Ser: 1.66 mg/dL — ABNORMAL HIGH (ref 0.61–1.24)
GFR calc Af Amer: 45 mL/min — ABNORMAL LOW (ref >60)
GFR calc non Af Amer: 38 mL/min — ABNORMAL LOW (ref >60)
GLUCOSE: 246 mg/dL — AB (ref 65–99)
POTASSIUM: 3.9 mmol/L (ref 3.5–5.1)
Sodium: 130 mmol/L — ABNORMAL LOW (ref 135–145)

## 2016-10-06 LAB — PROTIME-INR
INR: 10.67
Prothrombin Time: 87.7 seconds — ABNORMAL HIGH (ref 11.4–15.2)

## 2016-10-06 LAB — TROPONIN I

## 2016-10-06 MED ORDER — CIPROFLOXACIN IN D5W 400 MG/200ML IV SOLN
400.0000 mg | Freq: Once | INTRAVENOUS | Status: AC
  Administered 2016-10-06: 400 mg via INTRAVENOUS
  Filled 2016-10-06: qty 200

## 2016-10-06 MED ORDER — ONDANSETRON HCL 4 MG/2ML IJ SOLN: 4.0000 mg | Freq: Four times a day (QID) | INTRAMUSCULAR | Status: DC | PRN

## 2016-10-06 MED ORDER — GABAPENTIN 300 MG PO CAPS
300.0000 mg | ORAL_CAPSULE | Freq: Every day | ORAL | Status: DC
  Administered 2016-10-07 – 2016-10-08 (×2): 300 mg via ORAL
  Filled 2016-10-06 (×2): qty 1

## 2016-10-06 MED ORDER — ACETAMINOPHEN 650 MG RE SUPP
650.0000 mg | Freq: Four times a day (QID) | RECTAL | Status: DC | PRN
Start: 2016-10-06 — End: 2016-10-08

## 2016-10-06 MED ORDER — TICAGRELOR 90 MG PO TABS
90.0000 mg | ORAL_TABLET | Freq: Two times a day (BID) | ORAL | Status: DC
  Administered 2016-10-06 – 2016-10-07 (×2): 90 mg via ORAL
  Filled 2016-10-06 (×2): qty 1

## 2016-10-06 MED ORDER — PHYTONADIONE 5 MG PO TABS
2.5000 mg | ORAL_TABLET | Freq: Once | ORAL | Status: AC
  Administered 2016-10-06: 2.5 mg via ORAL
  Filled 2016-10-06: qty 1

## 2016-10-06 MED ORDER — ENOXAPARIN SODIUM 40 MG/0.4ML ~~LOC~~ SOLN: 40.0000 mg | SUBCUTANEOUS | Status: DC

## 2016-10-06 MED ORDER — INSULIN ASPART 100 UNIT/ML ~~LOC~~ SOLN
0.0000 [IU] | Freq: Every day | SUBCUTANEOUS | Status: DC
  Administered 2016-10-06: 2 [IU] via SUBCUTANEOUS
  Filled 2016-10-06: qty 2

## 2016-10-06 MED ORDER — CARVEDILOL 12.5 MG PO TABS
12.5000 mg | ORAL_TABLET | Freq: Two times a day (BID) | ORAL | Status: DC
  Administered 2016-10-07 – 2016-10-08 (×3): 12.5 mg via ORAL
  Filled 2016-10-06 (×3): qty 1

## 2016-10-06 MED ORDER — DEXTROSE 5 % IV SOLN
2.0000 g | INTRAVENOUS | Status: DC
  Administered 2016-10-06 – 2016-10-08 (×2): 2 g via INTRAVENOUS
  Filled 2016-10-06 (×3): qty 2

## 2016-10-06 MED ORDER — SODIUM CHLORIDE 0.9% FLUSH
3.0000 mL | Freq: Two times a day (BID) | INTRAVENOUS | Status: DC
  Administered 2016-10-06 – 2016-10-08 (×3): 3 mL via INTRAVENOUS

## 2016-10-06 MED ORDER — ONDANSETRON HCL 4 MG PO TABS: 4.0000 mg | ORAL_TABLET | Freq: Four times a day (QID) | ORAL | Status: DC | PRN

## 2016-10-06 MED ORDER — ASPIRIN EC 81 MG PO TBEC
81.0000 mg | DELAYED_RELEASE_TABLET | Freq: Every day | ORAL | Status: DC
  Administered 2016-10-07 – 2016-10-08 (×2): 81 mg via ORAL
  Filled 2016-10-06 (×2): qty 1

## 2016-10-06 MED ORDER — SODIUM CHLORIDE 0.9 % IV SOLN
INTRAVENOUS | Status: AC
  Administered 2016-10-06: via INTRAVENOUS

## 2016-10-06 MED ORDER — INSULIN ASPART 100 UNIT/ML ~~LOC~~ SOLN
0.0000 [IU] | Freq: Three times a day (TID) | SUBCUTANEOUS | Status: DC
  Administered 2016-10-07: 2 [IU] via SUBCUTANEOUS
  Administered 2016-10-07 (×2): 3 [IU] via SUBCUTANEOUS
  Administered 2016-10-08 (×2): 2 [IU] via SUBCUTANEOUS
  Filled 2016-10-06 (×3): qty 2
  Filled 2016-10-06 (×2): qty 3

## 2016-10-06 MED ORDER — ACETAMINOPHEN 325 MG PO TABS
650.0000 mg | ORAL_TABLET | Freq: Four times a day (QID) | ORAL | Status: DC | PRN
Start: 2016-10-06 — End: 2016-10-08

## 2016-10-06 MED ORDER — SODIUM CHLORIDE 0.9 % IV BOLUS (SEPSIS)
500.0000 mL | Freq: Once | INTRAVENOUS | Status: AC
  Administered 2016-10-06: 500 mL via INTRAVENOUS

## 2016-10-06 MED ORDER — CEFTRIAXONE SODIUM-DEXTROSE 2-2.22 GM-% IV SOLR
2.0000 g | INTRAVENOUS | Status: DC
  Filled 2016-10-06: qty 50

## 2016-10-06 NOTE — H&P (Signed)
[facility] - [place] at [facility]   PATIENT NAME: Christopher Alvarado    MR#:  [id number]  DATE OF BIRTH:  1940-11-29  DATE OF ADMISSION:  10/06/2016  PRIMARY CARE PHYSICIAN: [facility]   REQUESTING/REFERRING PHYSICIAN: Alphonzo Lemmings, MD  CHIEF COMPLAINT:   Chief Complaint  Patient presents with  . IVC    HISTORY OF PRESENT ILLNESS:  Christopher Alvarado  is a 76 y.o. male who presents with Increased confusion. Patient is unable to contribute much to his own history, collateral information is taken from ED physician spoke with family as family is not available at this time to speak with this Clinical research associate. Family stated that they found the patient in his home, confused and with everything in a mess. He was brought here to the ED where he was found to have a urinary tract infection, AKI, and was in A. fib with RVR.  Family had to have the patient involuntarily committed in order for him to come. Hospitalists were called for admission and treatment.  PAST MEDICAL HISTORY:   Past Medical History:  Diagnosis Date  . CAD (coronary artery disease)   . Diabetes mellitus without complication (HCC)   . Hypertension   . Irregular heartbeat     PAST SURGICAL HISTORY:   Past Surgical History:  Procedure Laterality Date  . CARDIAC SURGERY    . CORONARY ANGIOPLASTY WITH STENT PLACEMENT  1995    SOCIAL HISTORY:   Social History  Substance Use Topics  . Smoking status: Former Games developer  . Smokeless tobacco: Never Used  . Alcohol use No    FAMILY HISTORY:   Family History  Problem Relation Age of Onset  . CAD Mother   . Hypertension Mother   . CAD Father   . Stroke Father   . CAD Sister   . Hypertension Sister   . CAD Brother   . Hypertension Brother     DRUG ALLERGIES:   Allergies  Allergen Reactions  . Penicillins     MEDICATIONS AT HOME:   Prior to Admission medications   Medication Sig Start Date End Date Taking? Authorizing  Provider  acetaminophen (ACETAMINOPHEN 8 HOUR) 650 MG CR tablet Take 650 mg by mouth every 8 (eight) hours as needed for pain.    Historical Provider, MD  amLODipine (NORVASC) 10 MG tablet Take 10 mg by mouth daily.    Historical Provider, MD  aspirin 81 MG tablet Take 81 mg by mouth daily.    Historical Provider, MD  carvedilol (COREG) 25 MG tablet Take 12.5 mg by mouth 2 (two) times daily with a meal.    Historical Provider, MD  furosemide (LASIX) 40 MG tablet Take 40 mg by mouth.    Historical Provider, MD  gabapentin (NEURONTIN) 300 MG capsule Take 300 mg by mouth daily.    Historical Provider, MD  Insulin NPH Isophane & Regular (HUMULIN 70/30 [place]) Inject into the skin.    Historical Provider, MD  losartan (COZAAR) 25 MG tablet Take 25 mg by mouth daily.    Historical Provider, MD  metFORMIN (GLUCOPHAGE) 1000 MG tablet Take 1,000 mg by mouth 2 (two) times daily with a meal.    Historical Provider, MD  potassium chloride SA (K-DUR,KLOR-CON) 20 MEQ tablet Take 20 mEq by mouth 2 (two) times daily.    Historical Provider, MD  ticagrelor (BRILINTA) 90 MG TABS tablet Take 90 mg by mouth 2 (two) times daily.    Historical Provider, MD  REVIEW OF SYSTEMS:  Review of Systems  Unable to perform ROS: Acuity of condition     VITAL SIGNS:   Vitals:   10/06/16 1902 10/06/16 1903  BP: 127/64   Pulse: 93   Resp: 16   Temp: 98.6 F (37 C)   TempSrc: Oral   SpO2: 97%   Weight:  99.8 kg (220 lb)  Height:   (1.803 m)   Wt Readings from Last 3 Encounters:  10/06/16 99.8 kg (220 lb)  05/10/16 104.3 kg (230 lb)  04/26/16 104.3 kg (230 lb)    PHYSICAL EXAMINATION:  Physical Exam  Vitals reviewed. Constitutional: He appears well-developed and well-nourished. No distress.  HENT:  Head: Normocephalic and atraumatic.  Mouth/Throat: Oropharynx is clear and moist.  Eyes: Conjunctivae and EOM are normal. Pupils are equal, round, and reactive to light. No scleral icterus.  Neck: Normal  range of motion. Neck supple. No JVD present. No thyromegaly present.  Cardiovascular: Intact distal pulses.  Exam reveals no gallop and no friction rub.   No murmur heard. Tachycardic, irregular rhythm  Respiratory: Effort normal and breath sounds normal. No respiratory distress. He has no wheezes. He has no rales.  GI: Soft. Bowel sounds are normal. He exhibits no distension. There is no tenderness.  Musculoskeletal: Normal range of motion. He exhibits no edema.  No arthritis, no gout  Lymphadenopathy:    He has no cervical adenopathy.  Neurological: He is alert. No cranial nerve deficit.  Unable to fully assess due to patient condition  Skin: Skin is warm and dry. No rash noted. No erythema.  Psychiatric:  Unable to assess due to patient condition    LABORATORY PANEL:   CBC  Recent Labs Lab 10/06/16 1951  WBC 8.9  HGB 13.8  HCT 41.8  PLT 216   ------------------------------------------------------------------------------------------------------------------  Chemistries   Recent Labs Lab 10/06/16 1951  NA 130*  K 3.9  CL 90*  CO2 26  GLUCOSE 246*  BUN 20  CREATININE 1.66*  CALCIUM 9.0   ------------------------------------------------------------------------------------------------------------------  Cardiac Enzymes No results for input(s): TROPONINI in the last 168 hours. ------------------------------------------------------------------------------------------------------------------  RADIOLOGY:  No results found.  EKG:   Orders placed or performed during the hospital encounter of 10/06/16  . EKG 12-Lead  . EKG 12-Lead    IMPRESSION AND PLAN:  Principal Problem:   UTI (urinary tract infection) - IV antibiotics, urine culture sent from ED Active Problems:   AKI (acute kidney injury) (HCC) - likely due to his UTI, possibly some dehydration.  Gentle IV fluids tonight and monitor for expected improvement, avoid nephrotoxins   Atrial fibrillation with  RVR (HCC) - continue home meds, patient is almost rate controlled, we will use a dose of IV metoprolol now and then as needed in order to keep him rate controlled above and beyond his home medications. Per chart review and care everywhere The patient may have been on warfarin at some point, it is unclear whether he is still on warfarin at this time as family states that he may not have been taking several of his medications. We will not order warfarin at this time, we will try to track down whether he is still on it. INR pending   Acute encephalopathy - due to his UTI, monitor for expected improvement with resolution of the same   CAD (coronary artery disease) - continue home meds   HTN (hypertension) - continue home meds   Diabetes (HCC) - sliding scale insulin with corresponding glucose checks  All the records are reviewed and case discussed with ED provider. Management plans discussed with the patient and/or family.  DVT PROPHYLAXIS: SubQ lovenox  GI PROPHYLAXIS: None  ADMISSION STATUS: Inpatient  CODE STATUS: Full Code Status History    This patient does not have a recorded code status. Please follow your organizational policy for patients in this situation.      TOTAL TIME TAKING CARE OF THIS PATIENT: 45 minutes.   Citlally Captain FIELDING 10/06/2016, 8:51 PM  [facility]  Office  [phone]  CC: Primary care physician; [facility]  Note:  This document was prepared using Dragon voice recognition software and may include unintentional dictation errors.

## 2016-10-06 NOTE — Progress Notes (Signed)
ANTICOAGULATION CONSULT NOTE - Initial Consult  Pharmacy Consult for warfarin dosing Indication: atrial fibrillation  Allergies  Allergen Reactions  . Penicillins     Patient Measurements: Height:  (180.3 cm) Weight: 220 lb (99.8 kg) IBW/kg (Calculated) : 75.3 Heparin Dosing Weight:   Vital Signs: Temp: 98.1 F (36.7 C) (04/23 2255) Temp Source: Oral (04/23 2255) BP: 102/53 (04/23 2255) Pulse Rate: 112 (04/23 2255)  Labs:  Recent Labs  10/06/16 1951 10/06/16 2059  HGB 13.8  --   HCT 41.8  --   PLT 216  --   LABPROT  --  87.7*  INR  --  10.67*  CREATININE 1.66*  --   TROPONINI  --  <0.03    Estimated Creatinine Clearance: 45.6 mL/min (A) (by C-G formula based on SCr of 1.66 mg/dL (H)).   Medical History: Past Medical History:  Diagnosis Date  . CAD (coronary artery disease)   . Diabetes mellitus without complication (HCC)   . Hypertension   . Irregular heartbeat     Medications:  Scheduled:  . [START ON 10/07/2016] aspirin EC  81 mg Oral Daily  . [START ON 10/07/2016] carvedilol  12.5 mg Oral BID WC  . enoxaparin (LOVENOX) injection  40 mg Subcutaneous Q24H  . [START ON 10/07/2016] gabapentin  300 mg Oral Daily  . insulin aspart  0-5 Units Subcutaneous QHS  . [START ON 10/07/2016] insulin aspart  0-9 Units Subcutaneous TID WC  . phytonadione  2.5 mg Oral Once  . sodium chloride flush  3 mL Intravenous Q12H  . ticagrelor  90 mg Oral BID    Assessment: Patient admitted w/ AMS and found to have an INR of 10.6 on admit. Per care everywhere at [facility] patient takes warfarin  daily w/ evening meal - will follow-up on anticoagulation once INR < 3.0  Goal of Therapy:  INR 2-3 Monitor platelets by anticoagulation protocol: Yes   Plan:  Will hold all anticoagulation until INR reaches < 3.0 Will monitor daily INRs for 5 days  Thomasene Ripple, PharmD, BCPS Clinical Pharmacist 10/06/2016

## 2016-10-06 NOTE — ED Provider Notes (Signed)
[facility] Emergency Department Provider Note  ____________________________________________   I have reviewed the triage vital signs and the nursing notes.   HISTORY  Chief Complaint IVC    HPI Christopher Alvarado is a 76 y.o. male with a history of diabetes, hypertension, dementia, atrial fibrillation, head bleed which resulted in difficulty swallowing, for which she has a G-tube. Patient, according family, [facility], they feel he should not been discharged. He is supposed to be putting his meds in through his tube. He is not doing so. Family became concerned because the patient is more confused and they came with social services. Patient did not wish to be transported but social services is concerned gets his meds are "on the jumble" and he is not taking them appropriately. They do not feel he is in a position to take care of himself and he lives by himself. He himself denies everything and just wants to go home he is under IVC from social work     Past Medical History:  Diagnosis Date  . Diabetes mellitus without complication (HCC)   . Hypertension   . Irregular heartbeat     There are no active problems to display for this patient.   Past Surgical History:  Procedure Laterality Date  . CARDIAC SURGERY    . CORONARY ANGIOPLASTY WITH STENT PLACEMENT  1995    Prior to Admission medications   Medication Sig Start Date End Date Taking? Authorizing Provider  acetaminophen (ACETAMINOPHEN 8 HOUR) 650 MG CR tablet Take 650 mg by mouth every 8 (eight) hours as needed for pain.    Historical Provider, MD  amLODipine (NORVASC) 10 MG tablet Take 10 mg by mouth daily.    Historical Provider, MD  aspirin 81 MG tablet Take 81 mg by mouth daily.    Historical Provider, MD  carvedilol (COREG) 25 MG tablet Take 12.5 mg by mouth 2 (two) times daily with a meal.    Historical Provider, MD  furosemide (LASIX) 40 MG tablet Take 40 mg by mouth.     Historical Provider, MD  gabapentin (NEURONTIN) 300 MG capsule Take 300 mg by mouth daily.    Historical Provider, MD  Insulin NPH Isophane & Regular (HUMULIN 70/30 [place]) Inject into the skin.    Historical Provider, MD  losartan (COZAAR) 25 MG tablet Take 25 mg by mouth daily.    Historical Provider, MD  metFORMIN (GLUCOPHAGE) 1000 MG tablet Take 1,000 mg by mouth 2 (two) times daily with a meal.    Historical Provider, MD  potassium chloride SA (K-DUR,KLOR-CON) 20 MEQ tablet Take 20 mEq by mouth 2 (two) times daily.    Historical Provider, MD  ticagrelor (BRILINTA) 90 MG TABS tablet Take 90 mg by mouth 2 (two) times daily.    Historical Provider, MD    Allergies Penicillins  No family history on file.  Social History Social History  Substance Use Topics  . Smoking status: Former Games developer  . Smokeless tobacco: Never Used  . Alcohol use No    Review of Systems Constitutional: No fever/chills Eyes: No visual changes. ENT: No sore throat. No stiff neck no neck pain Cardiovascular: Denies chest pain. Respiratory: Denies shortness of breath. Gastrointestinal:   no vomiting.  No diarrhea.  No constipation. Genitourinary: Negative for dysuria. Musculoskeletal: Negative lower extremity swelling Skin: Negative for rash. Neurological: Negative for severe headaches, focal weakness or numbness. 10-point ROS otherwise negative.  ____________________________________________   PHYSICAL EXAM:  VITAL SIGNS: ED  Triage Vitals  Enc Vitals Group     BP 10/06/16 1902 127/64     Pulse Rate 10/06/16 1902 93     Resp 10/06/16 1902 16     Temp 10/06/16 1902 98.6 F (37 C)     Temp Source 10/06/16 1902 Oral     SpO2 10/06/16 1902 97 %     Weight 10/06/16 1903 220 lb (99.8 kg)     Height 10/06/16 1903  (1.803 m)     Head Circumference --      Peak Flow --      Pain Score --      Pain Loc --      Pain Edu? --      Excl. in GC? --     Constitutional: Alert and orientedTo name and  place unsure of the date Well appearing and in no acute distress. Eyes: Conjunctivae are normal. PERRL. EOMI. Head: Atraumatic. Nose: No congestion/rhinnorhea. Mouth/Throat: Mucous membranes are moist.  Oropharynx non-erythematous. Neck: No stridor.   Nontender with no meningismus Cardiovascular: Irregularly irregular. Grossly normal heart sounds.  Good peripheral circulation. Respiratory: Normal respiratory effort.  No retractions. Lungs CTAB. Abdominal: Soft and nontender. No distention. No guarding no rebound G-tube in place Back:  There is no focal tenderness or step off.  there is no midline tenderness there are no lesions noted. there is no CVA tenderness  Musculoskeletal: No lower extremity tenderness, no upper extremity tenderness. No joint effusions, no DVT signs strong distal pulses no edema Neurologic:  Normal speech and language. No gross focal neurologic deficits are appreciated.  Skin:  Skin is warm, dry and intact. No rash noted. Psychiatric: Mood and affect are normal. Speech and behavior are normal.  ____________________________________________   LABS (all labs ordered are listed, but only abnormal results are displayed)  Labs Reviewed  GLUCOSE, CAPILLARY - Abnormal; Notable for the following:       Result Value   Glucose-Capillary 251 (*)    All other components within normal limits  BASIC METABOLIC PANEL - Abnormal; Notable for the following:    Sodium 130 (*)    Chloride 90 (*)    Glucose, Bld 246 (*)    Creatinine, Ser 1.66 (*)    GFR calc non Af Amer 38 (*)    GFR calc Af Amer 45 (*)    All other components within normal limits  CBC WITH DIFFERENTIAL/PLATELET - Abnormal; Notable for the following:    RDW 16.8 (*)    Neutro Abs 7.0 (*)    All other components within normal limits  URINALYSIS, COMPLETE (UACMP) WITH MICROSCOPIC - Abnormal; Notable for the following:    Color, Urine YELLOW (*)    APPearance CLOUDY (*)    Glucose, UA 150 (*)    Hgb urine  dipstick LARGE (*)    Ketones, ur 20 (*)    Protein, ur 100 (*)    Leukocytes, UA LARGE (*)    Bacteria, UA MANY (*)    All other components within normal limits  URINE CULTURE   ____________________________________________  EKG  I personally interpreted any EKGs ordered by me or triage Atrial fibrillation with rapid ventricular response rate 118, right bundle-branch block noted, ____________________________________________  RADIOLOGY  I reviewed any imaging ordered by me or triage that were performed during my shift and, if possible, patient and/or family made aware of any abnormal findings. ____________________________________________   PROCEDURES  Procedure(s) performed: None  Procedures  Critical Care performed: None  ____________________________________________  INITIAL IMPRESSION / ASSESSMENT AND PLAN / ED COURSE  Pertinent labs & imaging results that were available during my care of the patient were reviewed by me and considered in my medical decision making (see chart for details).  Patient here with increasing confusion per family and able to take care of himself IVC was taken out. That was only to get him here. He has no SI or HI. Patient has multiple different findings on baseline screening including A. fib with RVR likely secondary to noncompliance with his heart medications, as well as a urinary tract infection which could be accounting for his increased confusion. Patient will be given antibiotics for this. He is allergic to penicillin. We'll try Cipro. Patient vital signs are reassuring that he is in rapid ventricular response. We will see if we can give him his medications there is tube. Heart rate is between 90 and 120. Renal function shows evidence of acute renal injury which is likely secondary to dehydration, we will give him IV fluid. He does appear to be somewhat dehydrated.    ____________________________________________   FINAL CLINICAL  IMPRESSION(S) / ED DIAGNOSES  Final diagnoses:  None      This chart was dictated using voice recognition software.  Despite best efforts to proofread,  errors can occur which can change meaning.      Jeanmarie Plant, MD 10/06/16 2029

## 2016-10-06 NOTE — Progress Notes (Signed)
Pharmacy Antibiotic Note  Christopher Alvarado is a 76 y.o. male admitted on 10/06/2016 with UTI.  Pharmacy has been consulted for Ceftriaxone dosing.  Plan: Will dose ceftriaxone 2g IV daily for 5 to 7 days  Height:  (180.3 cm) Weight: 220 lb (99.8 kg) IBW/kg (Calculated) : 75.3  Temp (24hrs), Avg:98.4 F (36.9 C), Min:98.1 F (36.7 C), Max:98.6 F (37 C)   Recent Labs Lab 10/06/16 1951  WBC 8.9  CREATININE 1.66*    Estimated Creatinine Clearance: 45.6 mL/min (A) (by C-G formula based on SCr of 1.66 mg/dL (H)).    Allergies  Allergen Reactions  . Penicillins     Antimicrobials this admission: 4/23 cipro >> 4/23  Thank you for allowing pharmacy to be a part of this patient's care.  Thomasene Ripple, PharmD, BCPS Clinical Pharmacist 10/06/2016

## 2016-10-06 NOTE — ED Triage Notes (Signed)
Patient from home via [facility]. EMS reports they were called by DSS regarding patient's inability to care for self. Patient was recently discharged from hospital and home health follow-up reports patient was not taking medication as prescribed. Patient states he has multiple home meds and is unable to take them all at one time but has been doing the best he can. Patient denies pain or any other complaints at this time. Patient alert to person, place and time upon arrival.

## 2016-10-06 NOTE — ED Notes (Signed)
ED physician notified of critical lab values. Admitting MD paged.

## 2016-10-07 DIAGNOSIS — F03A0 Unspecified dementia, mild, without behavioral disturbance, psychotic disturbance, mood disturbance, and anxiety: Secondary | ICD-10-CM

## 2016-10-07 DIAGNOSIS — F0390 Unspecified dementia without behavioral disturbance: Secondary | ICD-10-CM

## 2016-10-07 LAB — CBC
HEMATOCRIT: 39.1 % — AB (ref 40.0–52.0)
HEMOGLOBIN: 13.4 g/dL (ref 13.0–18.0)
MCH: 27.8 pg (ref 26.0–34.0)
MCHC: 34.2 g/dL (ref 32.0–36.0)
MCV: 81.4 fL (ref 80.0–100.0)
Platelets: 210 10*3/uL (ref 150–440)
RBC: 4.8 MIL/uL (ref 4.40–5.90)
RDW: 16.3 % — ABNORMAL HIGH (ref 11.5–14.5)
WBC: 8.5 10*3/uL (ref 3.8–10.6)

## 2016-10-07 LAB — BASIC METABOLIC PANEL
ANION GAP: 12 (ref 5–15)
BUN: 17 mg/dL (ref 6–20)
CO2: 29 mmol/L (ref 22–32)
Calcium: 8.8 mg/dL — ABNORMAL LOW (ref 8.9–10.3)
Chloride: 93 mmol/L — ABNORMAL LOW (ref 101–111)
Creatinine, Ser: 1.35 mg/dL — ABNORMAL HIGH (ref 0.61–1.24)
GFR calc Af Amer: 57 mL/min — ABNORMAL LOW (ref >60)
GFR, EST NON AFRICAN AMERICAN: 49 mL/min — AB (ref >60)
GLUCOSE: 215 mg/dL — AB (ref 65–99)
POTASSIUM: 2.7 mmol/L — AB (ref 3.5–5.1)
Sodium: 134 mmol/L — ABNORMAL LOW (ref 135–145)

## 2016-10-07 LAB — GLUCOSE, CAPILLARY
GLUCOSE-CAPILLARY: 212 mg/dL — AB (ref 65–99)
GLUCOSE-CAPILLARY: 155 mg/dL — AB (ref 65–99)
Glucose-Capillary: 202 mg/dL — ABNORMAL HIGH (ref 65–99)
Glucose-Capillary: 153 mg/dL — ABNORMAL HIGH (ref 65–99)

## 2016-10-07 LAB — PHOSPHORUS: PHOSPHORUS: 2.3 mg/dL — AB (ref 2.5–4.6)

## 2016-10-07 LAB — POTASSIUM: Potassium: 3.3 mmol/L — ABNORMAL LOW (ref 3.5–5.1)

## 2016-10-07 LAB — MAGNESIUM
MAGNESIUM: 1.5 mg/dL — AB (ref 1.7–2.4)
Magnesium: 2.4 mg/dL (ref 1.7–2.4)

## 2016-10-07 LAB — PROTIME-INR
INR: 6.29 — AB
Prothrombin Time: 57.5 seconds — ABNORMAL HIGH (ref 11.4–15.2)

## 2016-10-07 MED ORDER — TRAZODONE HCL 50 MG PO TABS: 25.0000 mg | ORAL_TABLET | Freq: Every evening | ORAL | Status: DC | PRN

## 2016-10-07 MED ORDER — WARFARIN SODIUM 6 MG PO TABS: 6.0000 mg | ORAL_TABLET | Freq: Every day | ORAL | Status: DC

## 2016-10-07 MED ORDER — MAGNESIUM SULFATE 4 GM/100ML IV SOLN
4.0000 g | Freq: Once | INTRAVENOUS | Status: AC
  Administered 2016-10-07: 4 g via INTRAVENOUS
  Filled 2016-10-07: qty 100

## 2016-10-07 MED ORDER — K PHOS MONO-SOD PHOS DI & MONO 155-852-130 MG PO TABS
500.0000 mg | ORAL_TABLET | ORAL | Status: AC
  Administered 2016-10-07 – 2016-10-08 (×2): 500 mg via ORAL
  Filled 2016-10-07 (×2): qty 2

## 2016-10-07 MED ORDER — LOSARTAN POTASSIUM 25 MG PO TABS
25.0000 mg | ORAL_TABLET | Freq: Every day | ORAL | Status: DC
  Administered 2016-10-07: 25 mg via ORAL
  Filled 2016-10-07: qty 1

## 2016-10-07 MED ORDER — LINAGLIPTIN 5 MG PO TABS
5.0000 mg | ORAL_TABLET | Freq: Every day | ORAL | Status: DC
  Administered 2016-10-08: 5 mg via ORAL
  Filled 2016-10-07: qty 1

## 2016-10-07 MED ORDER — ATORVASTATIN CALCIUM 20 MG PO TABS
80.0000 mg | ORAL_TABLET | Freq: Every day | ORAL | Status: DC
  Administered 2016-10-07 – 2016-10-08 (×2): 80 mg via ORAL
  Filled 2016-10-07 (×2): qty 4

## 2016-10-07 MED ORDER — FUROSEMIDE 40 MG PO TABS: 40.0000 mg | ORAL_TABLET | Freq: Every day | ORAL | Status: DC

## 2016-10-07 MED ORDER — QUETIAPINE FUMARATE 25 MG PO TABS: 12.5000 mg | ORAL_TABLET | Freq: Every evening | ORAL | Status: DC | PRN

## 2016-10-07 MED ORDER — METOPROLOL TARTRATE 5 MG/5ML IV SOLN
5.0000 mg | Freq: Four times a day (QID) | INTRAVENOUS | Status: DC | PRN
  Administered 2016-10-07: 5 mg via INTRAVENOUS
  Filled 2016-10-07: qty 5

## 2016-10-07 MED ORDER — LEVETIRACETAM 750 MG PO TABS
750.0000 mg | ORAL_TABLET | Freq: Two times a day (BID) | ORAL | Status: DC
  Administered 2016-10-07 – 2016-10-08 (×2): 750 mg via ORAL
  Filled 2016-10-07 (×3): qty 1

## 2016-10-07 MED ORDER — POTASSIUM CHLORIDE CRYS ER 20 MEQ PO TBCR
20.0000 meq | EXTENDED_RELEASE_TABLET | Freq: Once | ORAL | Status: AC
  Administered 2016-10-07: 20 meq via ORAL
  Filled 2016-10-07: qty 1

## 2016-10-07 MED ORDER — FLUTICASONE PROPIONATE 50 MCG/ACT NA SUSP
1.0000 | Freq: Every day | NASAL | Status: DC
  Administered 2016-10-07 – 2016-10-08 (×2): 1 via NASAL
  Filled 2016-10-07: qty 16

## 2016-10-07 MED ORDER — SODIUM CHLORIDE 0.9 % IV SOLN
INTRAVENOUS | Status: DC
  Administered 2016-10-07: 20:00:00 via INTRAVENOUS

## 2016-10-07 MED ORDER — MAGNESIUM OXIDE 400 (241.3 MG) MG PO TABS: 800.0000 mg | ORAL_TABLET | Freq: Two times a day (BID) | ORAL | Status: DC

## 2016-10-07 MED ORDER — LINAGLIPTIN 5 MG PO TABS: 5.0000 mg | ORAL_TABLET | Freq: Every day | ORAL | Status: DC

## 2016-10-07 MED ORDER — POTASSIUM CHLORIDE CRYS ER 20 MEQ PO TBCR
40.0000 meq | EXTENDED_RELEASE_TABLET | Freq: Once | ORAL | Status: AC
  Administered 2016-10-07: 40 meq via ORAL
  Filled 2016-10-07: qty 2

## 2016-10-07 MED ORDER — SODIUM CHLORIDE 0.9 % IV SOLN
30.0000 meq | Freq: Once | INTRAVENOUS | Status: AC
  Administered 2016-10-07: 30 meq via INTRAVENOUS
  Filled 2016-10-07: qty 15

## 2016-10-07 MED ORDER — MAGNESIUM OXIDE 400 (241.3 MG) MG PO TABS
800.0000 mg | ORAL_TABLET | Freq: Two times a day (BID) | ORAL | Status: DC
  Administered 2016-10-07 – 2016-10-08 (×2): 800 mg via ORAL
  Filled 2016-10-07 (×2): qty 2

## 2016-10-07 NOTE — NC FL2 (Signed)
[place] MEDICAID FL2 LEVEL OF CARE SCREENING TOOL     IDENTIFICATION  Patient Name: Christopher Alvarado Birthdate: 1941-05-24 Sex: male Admission Date (Current Location): 10/06/2016  [place] Number:  [address] [id number] [facility] Facility and Address:  [facility], [address] [place], [place] [address]      Provider Number: [id number]  Attending Physician Name and Address:  Enedina Finner, MD  Relative Name and Phone Number:  Avie Arenas [phone] or Jefferies,Caroline Sister   [phone]     Current Level of Care: Hospital Recommended Level of Care: Skilled Nursing Facility Prior Approval Number:    Date Approved/Denied:   PASRR Number: [id number]  Discharge Plan: SNF    Current Diagnoses: Patient Active Problem List   Diagnosis Date Noted  . UTI (urinary tract infection) 10/06/2016  . AKI (acute kidney injury) (HCC) 10/06/2016  . Atrial fibrillation with RVR (HCC) 10/06/2016  . Acute encephalopathy 10/06/2016  . CAD (coronary artery disease) 10/06/2016  . HTN (hypertension) 10/06/2016  . Diabetes (HCC) 10/06/2016    Orientation RESPIRATION BLADDER Height & Weight     Place  Normal Continent Weight: 220 lb (99.8 kg) Height:   (180.3 cm)  BEHAVIORAL SYMPTOMS/MOOD NEUROLOGICAL BOWEL NUTRITION STATUS      Continent Diet (Carb Modified)  AMBULATORY STATUS COMMUNICATION OF NEEDS Skin   Limited Assist Verbally Normal                       Personal Care Assistance Level of Assistance  Bathing, Feeding, Dressing Bathing Assistance: Limited assistance Feeding assistance: Independent Dressing Assistance: Limited assistance     Functional Limitations Info  Sight, Hearing, Speech Sight Info: Adequate Hearing Info: Adequate Speech Info: Adequate    SPECIAL CARE FACTORS FREQUENCY  PT (By licensed PT)     PT Frequency: 5x a week              Contractures Contractures Info: Not present     Additional Factors Info  Code Status, Allergies, Insulin Sliding Scale Code Status Info: Full Code Allergies Info: Penicillins   Insulin Sliding Scale Info: insulin aspart (novoLOG) injection 0-9 Units 3x a day with meals       Current Medications (10/07/2016):  This is the current hospital active medication list Current Facility-Administered Medications  Medication Dose Route Frequency Provider Last Rate Last Dose  . acetaminophen (TYLENOL) tablet 650 mg  650 mg Oral Q6H PRN Oralia Manis, MD       Or  . acetaminophen (TYLENOL) suppository 650 mg  650 mg Rectal Q6H PRN Oralia Manis, MD      . aspirin EC tablet 81 mg  81 mg Oral Daily Oralia Manis, MD   81 mg at 10/07/16 0856  . atorvastatin (LIPITOR) tablet 80 mg  80 mg Oral Daily Enedina Finner, MD      . carvedilol (COREG) tablet 12.5 mg  12.5 mg Oral BID WC Oralia Manis, MD   12.5 mg at 10/07/16 0856  . cefTRIAXone (ROCEPHIN) 2 g in dextrose 5 % 50 mL IVPB  2 g Intravenous Q24H Oralia Manis, MD 100 mL/hr at 10/06/16 2352 2 g at 10/06/16 2352  . fluticasone (FLONASE) 50 MCG/ACT nasal spray 1 spray  1 spray Each Nare Daily Enedina Finner, MD      . furosemide (LASIX) tablet 40 mg  40 mg Per Tube Daily Enedina Finner, MD      . gabapentin (NEURONTIN) capsule 300 mg  300 mg Oral  Daily Oralia Manis, MD   300 mg at 10/07/16 0857  . insulin aspart (novoLOG) injection 0-5 Units  0-5 Units Subcutaneous QHS Oralia Manis, MD   2 Units at 10/06/16 2350  . insulin aspart (novoLOG) injection 0-9 Units  0-9 Units Subcutaneous TID WC Oralia Manis, MD   3 Units at 10/07/16 1242  . levETIRAcetam (KEPPRA) tablet 750 mg  750 mg Oral BID Enedina Finner, MD      . linagliptin (TRADJENTA) tablet 5 mg  5 mg Per Tube Daily Enedina Finner, MD      . losartan (COZAAR) tablet 25 mg  25 mg Oral Daily Enedina Finner, MD      . magnesium oxide (MAG-OX) tablet 800 mg  800 mg Per Tube BID Enedina Finner, MD      . metoprolol (LOPRESSOR) injection 5 mg  5 mg Intravenous Q6H PRN Oralia Manis,  MD   5 mg at 10/07/16 1610  . ondansetron (ZOFRAN) tablet 4 mg  4 mg Oral Q6H PRN Oralia Manis, MD       Or  . ondansetron [facility]) injection 4 mg  4 mg Intravenous Q6H PRN Oralia Manis, MD      . QUEtiapine (SEROQUEL) tablet 12.5 mg  12.5 mg Per Tube QHS Enedina Finner, MD      . sodium chloride flush (NS) 0.9 % injection 3 mL  3 mL Intravenous Q12H Oralia Manis, MD   3 mL at 10/07/16 1000  . ticagrelor (BRILINTA) tablet 90 mg  90 mg Oral BID Oralia Manis, MD   90 mg at 10/07/16 0856  . traZODone (DESYREL) tablet 25 mg  25 mg Per Tube QHS PRN Enedina Finner, MD      . warfarin (COUMADIN) tablet 6 mg  6 mg Per Tube [id number] Enedina Finner, MD         Discharge Medications: Please see discharge summary for a list of discharge medications.  Relevant Imaging Results:  Relevant Lab Results:   Additional Information SSN [id number]  Darleene Cleaver, [place]

## 2016-10-07 NOTE — Progress Notes (Signed)
MEDICATION RELATED CONSULT NOTE - INITIAL   Pharmacy Consult for electrolyte monitoring/replacement Indication: hypokalemia/hypomagnesemia  Allergies  Allergen Reactions  . Penicillins     Patient Measurements: Height:  (180.3 cm) Weight: 220 lb (99.8 kg) IBW/kg (Calculated) : 75.3  Vital Signs: Temp: 97.8 F (36.6 C) (04/24 1936) Temp Source: Oral (04/24 1936) BP: 79/54 (04/24 1948) Pulse Rate: 66 (04/24 1948) Intake/Output from previous day: 04/23 0701 - 04/24 0700 In: 986.3 [I.V.:236.3; IV Piggyback:750] Out: 200 [Urine:200] Intake/Output from this shift: No intake/output data recorded.  Labs:  Recent Labs  10/06/16 1951 10/07/16 0638 10/07/16 1835  WBC 8.9 8.5  --   HGB 13.8 13.4  --   HCT 41.8 39.1*  --   PLT 216 210  --   CREATININE 1.66* 1.35*  --   MG  --  1.5* 2.4  PHOS  --   --  2.3*   Estimated Creatinine Clearance: 56 mL/min (A) (by C-G formula based on SCr of 1.35 mg/dL (H)).   Microbiology: No results found for this or any previous visit (from the past 720 hour(s)).  Medical History: Past Medical History:  Diagnosis Date  . CAD (coronary artery disease)   . Diabetes mellitus without complication (HCC)   . Hypertension   . Irregular heartbeat     Assessment: K = 3.3 Phos = 2.3 Mg = 2.4  Goal of Therapy:  Electrolytes WNL  Plan:  Give KCl 20 mEq PO x 1  Give Kphos neutral 500 mg PO q 4 hours x 2 doses  Will recheck electrolytes with am labs  Horris Latino, PharmD Pharmacy Resident 10/07/2016 7:50 PM

## 2016-10-07 NOTE — Consult Note (Signed)
[facility] Consult   Reason for Consult:  Consult for 76 year old man without any specific past psychiatric history brought to the hospital under IVC in order to get him to cooperate with treatment Referring Physician:  Posey Pronto Patient Identification: Christopher Alvarado MRN:  [id number] Principal Diagnosis: Mild dementia Diagnosis:   Patient Active Problem List   Diagnosis Date Noted  . 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 UTI (urinary tract infection) [N39.0] 10/06/2016  . AKI (acute kidney injury) ([place]) [N17.9] 10/06/2016  . Atrial fibrillation with RVR ([place]) [I48.91] 10/06/2016  . Acute encephalopathy [G93.40] 10/06/2016  . CAD (coronary artery disease) [I25.10] 10/06/2016  . HTN (hypertension) [I10] 10/06/2016  . Diabetes ([place]) [E11.9] 10/06/2016    Total Time spent with patient: 1 hour  Subjective:   Christopher Alvarado is a 76 y.o. male patient admitted with "I came to get my pills straightened out".  HPI:  Patient interviewed. Chart reviewed. 76 year old man was apparently brought into the hospital under involuntary commitment because his family was unable to get him to cooperate with treatment. When he first presented he was described as being unable to contribute any history of his own. On interview today the patient appears to have improved over that condition. Patient is able to tell me that he came into the hospital by ambulance. He is aware that he has a urinary tract infection. He reports that he does remember being initially unwilling to come into the hospital because he was afraid that he would be stuck staying here too long since being in the hospital he has basically been cooperative with treatment. Patient denies any mood symptoms. Denies feeling depressed. Totally denies suicidal or homicidal ideation. Doesn't have any complaints about sleep. Patient claims that he is able to eat fairly normally although according to his family that is an exaggeration.  Nevertheless he does seem to be allowed to eat some soft food. Patient claims that he had been taking his medication regularly at home although it sounds like there is some dispute about that. He is aware that he is supposed to be on medication at home and has an awareness that at least some of them are related to his diabetes. Patient denies any alcohol or drug abuse. He is stating at this point that he wants to go back home and feels that he will be safe in that living situation.  Social history: Patient states that he lives in his own home where he has lived since 63. Patient claims that there is always someone at home with him. This is contradicted by some earlier reports in the chart did state that his girlfriend is only they're part of the time. Patient claims that his sisters are able to do all of his cooking and all of the care for him. It sounds like that's not entirely clear. He reports that he has 2 sons but does not apparently here from them often.  Medical history: History of diabetes history it sounds like of some kind of stroke or brain injury that has limited his swallowing. History of blood clots.  Substance abuse history: Denies any current or past alcohol or drug abuse  Past Psychiatric History: No known past psychiatric history. No history of psychiatric hospitalization or medication. Denies any history of suicidality psychosis or dangerousness  Risk to Self: Is patient at risk for suicide?: No Risk to Others:   Prior Inpatient Therapy:   Prior Outpatient Therapy:    Past Medical History:  Past Medical History:  Diagnosis Date  . CAD (coronary artery disease)   . Diabetes mellitus without complication ([place])   . Hypertension   . Irregular heartbeat     Past Surgical History:  Procedure Laterality Date  . CARDIAC SURGERY    . CORONARY ANGIOPLASTY WITH STENT PLACEMENT  1995   Family History:  Family History  Problem Relation Age of Onset  . CAD Mother   .  Hypertension Mother   . CAD Father   . Stroke Father   . CAD Sister   . Hypertension Sister   . CAD Brother   . Hypertension Brother    Family Psychiatric  History: Does not know of any family history of mental health problems Social History:  History  Alcohol Use No     History  Drug Use No    Social History   Social History  . Marital status: Widowed    Spouse name: N/A  . Number of children: N/A  . Years of education: N/A   Social History Main Topics  . Smoking status: Former Research scientist (life sciences)  . Smokeless tobacco: Never Used  . Alcohol use No  . Drug use: No  . Sexual activity: Not Asked   Other Topics Concern  . None   Social History Narrative  . None   Additional Social History:    Allergies:   Allergies  Allergen Reactions  . Penicillins     Labs:  Results for orders placed or performed during the hospital encounter of 10/06/16 (from the past 48 hour(s))  Glucose, capillary     Status: Abnormal   Collection Time: 10/06/16  7:14 PM  Result Value Ref Range   Glucose-Capillary 251 (H) 65 - 99 mg/dL  Basic metabolic panel     Status: Abnormal   Collection Time: 10/06/16  7:51 PM  Result Value Ref Range   Sodium 130 (L) 135 - 145 mmol/L   Potassium 3.9 3.5 - 5.1 mmol/L    Comment: HEMOLYSIS AT THIS LEVEL MAY AFFECT RESULT   Chloride 90 (L) 101 - 111 mmol/L   CO2 26 22 - 32 mmol/L   Glucose, Bld 246 (H) 65 - 99 mg/dL   BUN 20 6 - 20 mg/dL   Creatinine, Ser 1.66 (H) 0.61 - 1.24 mg/dL   Calcium 9.0 8.9 - 10.3 mg/dL   GFR calc non Af Amer 38 (L) >60 mL/min   GFR calc Af Amer 45 (L) >60 mL/min    Comment: (NOTE) The eGFR has been calculated using the CKD EPI equation. This calculation has not been validated in all clinical situations. eGFR's persistently <60 mL/min signify possible Chronic Kidney Disease.    Anion gap 14 5 - 15  CBC with Differential     Status: Abnormal   Collection Time: 10/06/16  7:51 PM  Result Value Ref Range   WBC 8.9 3.8 - 10.6  K/uL   RBC 5.06 4.40 - 5.90 MIL/uL   Hemoglobin 13.8 13.0 - 18.0 g/dL   HCT 41.8 40.0 - 52.0 %   MCV 82.7 80.0 - 100.0 fL   MCH 27.4 26.0 - 34.0 pg   MCHC 33.1 32.0 - 36.0 g/dL   RDW 16.8 (H) 11.5 - 14.5 %   Platelets 216 150 - 440 K/uL   Neutrophils Relative % 79 %   Neutro Abs 7.0 (H) 1.4 - 6.5 K/uL   Lymphocytes Relative 12 %   Lymphs Abs 1.1 1.0 - 3.6 K/uL   Monocytes Relative 8 %   Monocytes Absolute  0.7 0.2 - 1.0 K/uL   Eosinophils Relative 1 %   Eosinophils Absolute 0.1 0 - 0.7 K/uL   Basophils Relative 0 %   Basophils Absolute 0.0 0 - 0.1 K/uL  Urinalysis, Complete w Microscopic     Status: Abnormal   Collection Time: 10/06/16  7:51 PM  Result Value Ref Range   Color, Urine YELLOW (A) YELLOW   APPearance CLOUDY (A) CLEAR   Specific Gravity, Urine 1.012 1.005 - 1.030   pH 6.0 5.0 - 8.0   Glucose, UA 150 (A) NEGATIVE mg/dL   Hgb urine dipstick LARGE (A) NEGATIVE   Bilirubin Urine NEGATIVE NEGATIVE   Ketones, ur 20 (A) NEGATIVE mg/dL   Protein, ur 100 (A) NEGATIVE mg/dL   Nitrite NEGATIVE NEGATIVE   Leukocytes, UA LARGE (A) NEGATIVE   RBC / HPF TOO NUMEROUS TO COUNT 0 - 5 RBC/hpf   WBC, UA TOO NUMEROUS TO COUNT 0 - 5 WBC/hpf   Bacteria, UA MANY (A) NONE SEEN   Squamous Epithelial / LPF NONE SEEN NONE SEEN   WBC Clumps PRESENT    Mucous PRESENT   Troponin I     Status: None   Collection Time: 10/06/16  8:59 PM  Result Value Ref Range   Troponin I <0.03 <0.03 ng/mL  Protime-INR     Status: Abnormal   Collection Time: 10/06/16  8:59 PM  Result Value Ref Range   Prothrombin Time 87.7 (H) 11.4 - 15.2 seconds   INR 10.67 (HH)     Comment: CRITICAL RESULT CALLED TO, READ BACK BY AND VERIFIED WITH: LAURA CATES AT 2136 ON 10/06/2016 JJB   Glucose, capillary     Status: Abnormal   Collection Time: 10/06/16 11:01 PM  Result Value Ref Range   Glucose-Capillary 222 (H) 65 - 99 mg/dL  Protime-INR     Status: Abnormal   Collection Time: 10/07/16  6:38 AM  Result Value  Ref Range   Prothrombin Time 57.5 (H) 11.4 - 15.2 seconds   INR 6.29 (HH)     Comment: CRITICAL RESULT CALLED TO, READ BACK BY AND VERIFIED WITH: TAYLOR BECK@0751  ON 10/07/16 BY HKP   Basic metabolic panel     Status: Abnormal   Collection Time: 10/07/16  6:38 AM  Result Value Ref Range   Sodium 134 (L) 135 - 145 mmol/L   Potassium 2.7 (LL) 3.5 - 5.1 mmol/L    Comment: CRITICAL RESULT CALLED TO, READ BACK BY AND VERIFIED WITH TAYLOR BECK ON 10/07/16 AT 0729 QSD    Chloride 93 (L) 101 - 111 mmol/L   CO2 29 22 - 32 mmol/L   Glucose, Bld 215 (H) 65 - 99 mg/dL   BUN 17 6 - 20 mg/dL   Creatinine, Ser 1.35 (H) 0.61 - 1.24 mg/dL   Calcium 8.8 (L) 8.9 - 10.3 mg/dL   GFR calc non Af Amer 49 (L) >60 mL/min   GFR calc Af Amer 57 (L) >60 mL/min    Comment: (NOTE) The eGFR has been calculated using the CKD EPI equation. This calculation has not been validated in all clinical situations. eGFR's persistently <60 mL/min signify possible Chronic Kidney Disease.    Anion gap 12 5 - 15  CBC     Status: Abnormal   Collection Time: 10/07/16  6:38 AM  Result Value Ref Range   WBC 8.5 3.8 - 10.6 K/uL   RBC 4.80 4.40 - 5.90 MIL/uL   Hemoglobin 13.4 13.0 - 18.0 g/dL   HCT 39.1 (L)  40.0 - 52.0 %   MCV 81.4 80.0 - 100.0 fL   MCH 27.8 26.0 - 34.0 pg   MCHC 34.2 32.0 - 36.0 g/dL   RDW 16.3 (H) 11.5 - 14.5 %   Platelets 210 150 - 440 K/uL  Magnesium     Status: Abnormal   Collection Time: 10/07/16  6:38 AM  Result Value Ref Range   Magnesium 1.5 (L) 1.7 - 2.4 mg/dL  Glucose, capillary     Status: Abnormal   Collection Time: 10/07/16  8:41 AM  Result Value Ref Range   Glucose-Capillary 212 (H) 65 - 99 mg/dL  Glucose, capillary     Status: Abnormal   Collection Time: 10/07/16 12:35 PM  Result Value Ref Range   Glucose-Capillary 202 (H) 65 - 99 mg/dL  Glucose, capillary     Status: Abnormal   Collection Time: 10/07/16  4:30 PM  Result Value Ref Range   Glucose-Capillary 155 (H) 65 - 99 mg/dL     Current Facility-Administered Medications  Medication Dose Route Frequency Provider Last Rate Last Dose  . acetaminophen (TYLENOL) tablet 650 mg  650 mg Oral Q6H PRN Lance Coon, MD       Or  . acetaminophen (TYLENOL) suppository 650 mg  650 mg Rectal Q6H PRN Lance Coon, MD      . aspirin EC tablet 81 mg  81 mg Oral Daily Lance Coon, MD   81 mg at 10/07/16 0856  . atorvastatin (LIPITOR) tablet 80 mg  80 mg Oral Daily Fritzi Mandes, MD   80 mg at 10/07/16 1702  . carvedilol (COREG) tablet 12.5 mg  12.5 mg Oral BID WC Lance Coon, MD   12.5 mg at 10/07/16 1702  . cefTRIAXone (ROCEPHIN) 2 g in dextrose 5 % 50 mL IVPB  2 g Intravenous Q24H Lance Coon, MD 100 mL/hr at 10/06/16 2352 2 g at 10/06/16 2352  . fluticasone (FLONASE) 50 MCG/ACT nasal spray 1 spray  1 spray Each Nare Daily Fritzi Mandes, MD      . Derrill Memo ON 10/08/2016] furosemide (LASIX) tablet 40 mg  40 mg Oral Daily Fritzi Mandes, MD      . gabapentin (NEURONTIN) capsule 300 mg  300 mg Oral Daily Lance Coon, MD   300 mg at 10/07/16 0857  . insulin aspart (novoLOG) injection 0-5 Units  0-5 Units Subcutaneous QHS Lance Coon, MD   2 Units at 10/06/16 2350  . insulin aspart (novoLOG) injection 0-9 Units  0-9 Units Subcutaneous TID WC Lance Coon, MD   2 Units at 10/07/16 1703  . levETIRAcetam (KEPPRA) tablet 750 mg  750 mg Oral BID Fritzi Mandes, MD      . Derrill Memo ON 10/08/2016] linagliptin (TRADJENTA) tablet 5 mg  5 mg Oral Daily Fritzi Mandes, MD      . losartan (COZAAR) tablet 25 mg  25 mg Oral Daily Fritzi Mandes, MD   25 mg at 10/07/16 1702  . magnesium oxide (MAG-OX) tablet 800 mg  800 mg Oral BID Fritzi Mandes, MD      . metoprolol (LOPRESSOR) injection 5 mg  5 mg Intravenous Q6H PRN Lance Coon, MD   5 mg at 10/07/16 6269  . ondansetron (ZOFRAN) tablet 4 mg  4 mg Oral Q6H PRN Lance Coon, MD       Or  . ondansetron [facility]) injection 4 mg  4 mg Intravenous Q6H PRN Lance Coon, MD      . QUEtiapine (SEROQUEL) tablet 12.5 mg  12.5 mg Oral  QHS PRN Fritzi Mandes, MD      . sodium chloride flush (NS) 0.9 % injection 3 mL  3 mL Intravenous Q12H Lance Coon, MD   3 mL at 10/07/16 1000  . traZODone (DESYREL) tablet 25 mg  25 mg Oral QHS PRN Fritzi Mandes, MD        Musculoskeletal: Strength & Muscle Tone: decreased Gait & Station: unable to stand Patient leans: N/A  Psychiatric Specialty Exam: Physical Exam  Nursing note and vitals reviewed. Constitutional: He appears well-developed and well-nourished.  HENT:  Head: Normocephalic and atraumatic.  Eyes: Conjunctivae are normal. Pupils are equal, round, and reactive to light.  Neck: Normal range of motion.  Cardiovascular: Regular rhythm and normal heart sounds.   Respiratory: Effort normal. No respiratory distress.  GI: Soft.  Musculoskeletal: Normal range of motion.  Neurological: He is alert.  Skin: Skin is warm and dry.  Psychiatric: He has a normal mood and affect. His speech is normal. He is slowed. Thought content is not paranoid. Cognition and memory are impaired. He expresses no homicidal and no suicidal ideation. He exhibits abnormal recent memory.    Review of Systems  HENT: Negative.   Eyes: Negative.   Respiratory: Negative.   Cardiovascular: Negative.   Gastrointestinal: Negative.   Musculoskeletal: Negative.   Skin: Negative.   Neurological: Positive for weakness.  Psychiatric/Behavioral: Negative for depression, hallucinations, memory loss, substance abuse and suicidal ideas. The patient is not nervous/anxious and does not have insomnia.     Blood pressure 113/72, pulse (!) 108, temperature 98.4 F (36.9 C), temperature source Oral, resp. rate 18, height 5' 11"  (1.803 m), weight 99.8 kg (220 lb), SpO2 100 %.Body mass index is 30.68 kg/m.  General Appearance: Casual  Eye Contact:  Good  Speech:  Slow  Volume:  Decreased  Mood:  Euthymic  Affect:  Appropriate  Thought Process:  Disorganized  Orientation:  Full (Time, Place, and Person)  Thought  Content:  Tangential  Suicidal Thoughts:  No  Homicidal Thoughts:  No  Memory:  Immediate;   Fair Recent;   Poor  Judgement:  Fair  Insight:  Shallow  Psychomotor Activity:  Decreased  Concentration:  Concentration: Fair  Recall:  [employer] of Knowledge:  Fair  Language:  Fair  Akathisia:  No  Handed:  Right  AIMS (if indicated):     Assets:  Communication Skills Desire for Improvement Financial Resources/Insurance Housing Social Support  ADL's:  Impaired  Cognition:  Impaired,  Mild  Sleep:        Treatment Plan Summary: Plan 76 year old man without a past psychiatric history. When he initially presented to the hospital he was diagnosed with acute encephalopathy and it sounds like he was clinically delirious. To my evaluation today the patient is not showing any signs of delirium. He does have some indication of dementia. His short-term memory was poor. It doesn't sound like he has a accurate representation of how much support he might have at home. Nevertheless at this point the patient does not meet commitment criteria. A mild degree of dementia without any suicidality or homicidality or indication of acute dangerousness is not committable. I have ordered discontinuation of IVC. As far as capacity it should first be noted that judgments about capacity are dependent none only on the specific circumstance in question but also on fluctuations in the patient's clinical state. If he were truly delirious when he presented to the hospital he probably didn't have any capacity at that point  but at this point I think he does meet the qualifications to have capacity to make his own decisions. Treatment team should work with the patient and especially work with his sisters to be very clear with him about how much assistance they would be able to provide for him. Right now he is going on the assumption that his sisters will be able to take care of all of his medication administration as well as  preparing his food and that he will be able to get assistance with bathing and toileting. He knows he needs help with those things but seems to believe that all of that will be provided. If that is not the case that should all be made very clear to the patient. I will follow-up as needed. No indication of any psychiatric medication.  Disposition: Patient does not meet criteria for psychiatric inpatient admission. Supportive therapy provided about ongoing stressors.  Alethia Berthold, MD 10/07/2016 5:57 PM

## 2016-10-07 NOTE — Clinical Social Work Placement (Signed)
   CLINICAL SOCIAL WORK PLACEMENT  NOTE  Date:  10/07/2016  Patient Details  Name: Christopher Alvarado MRN: [id number] Date of Birth: May 31, 1941  Clinical Social Work is seeking post-discharge placement for this patient at the Skilled  Nursing Facility level of care (*CSW will initial, date and re-position this form in  chart as items are completed):  Yes   Patient/family provided with [facility] Clinical Social Work Department's list of facilities offering this level of care within the geographic area requested by the patient (or if unable, by the patient's family).  Yes   Patient/family informed of their freedom to choose among providers that offer the needed level of care, that participate in Medicare, Medicaid or managed care program needed by the patient, have an available bed and are willing to accept the patient.  Yes   Patient/family informed of [facility]'s ownership interest in [facility] and [facility], as well as of the fact that they are under no obligation to receive care at these facilities.  PASRR submitted to EDS on 10/07/16     PASRR number received on       Existing PASRR number confirmed on 10/07/16     FL2 transmitted to all facilities in geographic area requested by pt/family on 10/07/16     FL2 transmitted to all facilities within larger geographic area on       Patient informed that his/her managed care company has contracts with or will negotiate with certain facilities, including the following:            Patient/family informed of bed offers received.  Patient chooses bed at       Physician recommends and patient chooses bed at      Patient to be transferred to   on  .  Patient to be transferred to facility by       Patient family notified on   of transfer.  Name of family member notified:        PHYSICIAN Please sign FL2     Additional Comment:    _______________________________________________ Darleene Cleaver,  LCSWA 10/07/2016, 4:21 PM

## 2016-10-07 NOTE — Clinical Social Work Note (Signed)
CSW received phone call from APS worker Irving Shows [phone] who said patient was IVC'd and is requesting that Psych complete a competency and capacity screen once they see patient.  APS is trying to determine if patient needs a legal guardian.  APS reports they feel patient may need a higher level of care depending on what psych says.  Ervin Knack. Gilberte Gorley, MSW, Theresia Majors [phone]  10/07/2016 10:10 AM

## 2016-10-07 NOTE — Progress Notes (Signed)
Notified physician of low blood pressure. Patient is asymptomatic.  Will place orders.

## 2016-10-07 NOTE — Progress Notes (Signed)
ANTICOAGULATION CONSULT NOTE - Initial Consult  Pharmacy Consult for warfarin dosing Indication: atrial fibrillation  Allergies  Allergen Reactions  . Penicillins     Patient Measurements: Height:  (180.3 cm) Weight: 220 lb (99.8 kg) IBW/kg (Calculated) : 75.3  Vital Signs: Temp: 97.9 F (36.6 C) (04/24 0503) Temp Source: Oral (04/24 0503) BP: 115/58 (04/24 0855) Pulse Rate: 92 (04/24 0855)  Labs:  Recent Labs  10/06/16 1951 10/06/16 2059 10/07/16 0638  HGB 13.8  --  13.4  HCT 41.8  --  39.1*  PLT 216  --  210  LABPROT  --  87.7* 57.5*  INR  --  10.67* 6.29*  CREATININE 1.66*  --  1.35*  TROPONINI  --  <0.03  --     Estimated Creatinine Clearance: 56 mL/min (A) (by C-G formula based on SCr of 1.35 mg/dL (H)).   Medical History: Past Medical History:  Diagnosis Date  . CAD (coronary artery disease)   . Diabetes mellitus without complication ([facility])   . Hypertension   . Irregular heartbeat     Medications:  Scheduled:  . aspirin EC  81 mg Oral Daily  . carvedilol  12.5 mg Oral BID WC  . gabapentin  300 mg Oral Daily  . insulin aspart  0-5 Units Subcutaneous QHS  . insulin aspart  0-9 Units Subcutaneous TID WC  . sodium chloride flush  3 mL Intravenous Q12H  . ticagrelor  90 mg Oral BID    Assessment: Patient admitted w/ AMS and found to have an INR of 10.6 on admit.  Per care everywhere at [facility] patient takes warfarin  daily w/ evening meal - will follow-up on anticoagulation once INR < 3.0  Dosing history: Date INR Dose 4/23 10.7 HELD 2.5 mg vitamin K PO given 4/24 6.29 HOLD  Goal of Therapy:  INR 2-3 Monitor platelets by anticoagulation protocol: Yes   Plan:  Will HOLD warfarin until INR reaches < 3.0 Will recheck INR with AM labs tomorrow  Cindi Carbon, PharmD, BCPS Clinical Pharmacist 10/07/2016

## 2016-10-07 NOTE — Clinical Social Work Note (Signed)
Clinical Social Work Assessment  Patient Details  Name: Christopher Alvarado MRN: [id number] Date of Birth: [birth year]  Date of referral:  10/07/16               Reason for consult:  Facility Placement                Permission sought to share information with:  Facility Medical sales representative, Family Supports Permission granted to share information::  Yes, Verbal Permission Granted  Name::     Avie Arenas [phone] or Jefferies,Caroline Sister   [phone]   Agency::  SNF admissions  Relationship::     Contact Information:     Housing/Transportation Living arrangements for the past 2 months:  Single Family Home Source of Information:  Other (Comment Required) (Patient's sister Christopher Alvarado.) Patient Interpreter Needed:  None Criminal Activity/Legal Involvement Pertinent to Current Situation/Hospitalization:  No - Comment as needed Significant Relationships:  Other Family Members Lives with:  Self Do you feel safe going back to the place where you live?  No Need for family participation in patient care:  Yes (Comment)  Care giving concerns:  Patient and family feels he needs some short term rehab before he is able to return back home.   Social Worker assessment / plan: Patient is a 76 year old male who is alert and oriented x2 but has some dementia.  Due to patient's dementia his sister Christopher Alvarado was contacted to complete assessment.  Patient has a girlfriend who checks on him a couple times a week, however patient does not know when to take his medications all the time, and per his sister's report the medications were all over his house.  Patient's girlfriend gives him his medications when she sees him but she does not see him daily.  Patient's sister had contacted APS who is following patient, and are requesting a psych consult to determine if patient has capacity to make his own decisions.  Patient's sister and APS feel patient may need higher level of care due to his  dementia and not being able to take his medications correctly.  Patient's sister gave CSW permission to begin SNF bed search in [place].  Employment status:  Retired Database administrator PT Recommendations:  Skilled Nursing Facility Information / Referral to community resources:  Skilled Nursing Facility  Patient/Family's Response to care:  Patient and family agreeable to going to SNF for short term rehab.  Patient/Family's Understanding of and Emotional Response to Diagnosis, Current Treatment, and Prognosis:  Patient's family is hopeful that he can go to SNF and transition to long term care.  Emotional Assessment Appearance:  Appears stated age Attitude/Demeanor/Rapport:    Affect (typically observed):  Appropriate, Calm Orientation:  Oriented to Self, Oriented to Place Alcohol / Substance use:  Not Applicable Psych involvement (Current and /or in the community):  No (Comment)  Discharge Needs  Concerns to be addressed:  Lack of Support Readmission within the last 30 days:  No Current discharge risk:  Lack of support system, Lives alone Barriers to Discharge:  Continued Medical Work up   [place] Constable 10/07/2016, 4:13 PM

## 2016-10-07 NOTE — Progress Notes (Signed)
Dr. Allena Katz paged regarding low potassium

## 2016-10-07 NOTE — Progress Notes (Signed)
[facility] - [place] at [facility]   PATIENT NAME: Christopher Alvarado    MR#:  [id number]  DATE OF BIRTH:  1940/12/14  SUBJECTIVE:   Brought in from home with confusion and concerns by family that pt is not caring for himself No the best historian Tells me he wants to go home No family in the room REVIEW OF SYSTEMS:   Review of Systems  Unable to perform ROS: Mental status change   Tolerating Diet:yes  Tolerating PT: pending  DRUG ALLERGIES:   Allergies  Allergen Reactions  . Penicillins     VITALS:  Blood pressure (!) 81/47, pulse 95, temperature 97.8 F (36.6 C), temperature source Oral, resp. rate 18, height  (1.803 m), weight 99.8 kg (220 lb), SpO2 100 %.  PHYSICAL EXAMINATION:   Physical Exam  GENERAL:  76 y.o.-year-old patient lying in the bed with no acute distress.  EYES: Pupils equal, round, reactive to light and accommodation. No scleral icterus. Extraocular muscles intact.  HEENT: Head atraumatic, normocephalic. Oropharynx and nasopharynx clear.  NECK:  Supple, no jugular venous distention. No thyroid enlargement, no tenderness.  LUNGS: Normal breath sounds bilaterally, no wheezing, rales, rhonchi. No use of accessory muscles of respiration.  CARDIOVASCULAR: S1, S2 normal. No murmurs, rubs, or gallops.  ABDOMEN: Soft, nontender, nondistended. Bowel sounds present. No organomegaly or mass. PEG+ EXTREMITIES: No cyanosis, clubbing or edema b/l.    NEUROLOGIC: Cranial nerves II through XII are intact. No focal Motor or sensory deficits b/l.   PSYCHIATRIC:  patient is alert and some confusion SKIN: No obvious rash, lesion, or ulcer.   LABORATORY PANEL:  CBC  Recent Labs Lab 10/07/16 0638  WBC 8.5  HGB 13.4  HCT 39.1*  PLT 210    Chemistries   Recent Labs Lab 10/07/16 0638 10/07/16 1835  NA 134*  --   K 2.7* 3.3*  CL 93*  --   CO2 29  --   GLUCOSE 215*  --   BUN 17  --   CREATININE 1.35*  --   CALCIUM 8.8*  --    MG 1.5* 2.4   Cardiac Enzymes  Recent Labs Lab 10/06/16 2059  TROPONINI <0.03   RADIOLOGY:  No results found. ASSESSMENT AND PLAN:   Christopher Alvarado  is a 76 y.o. male who presents with Increased confusion. Patient is unable to contribute much to his own history. Family stated that they found the patient in his home, confused and with everything in a mess. He was brought here to the ED where he was found to have a urinary tract infection, AKI, and was in A. fib with RVR  * UTI (urinary tract infection) - IV antibiotics, urine culture   * AKI (acute kidney injury) (HCC) - likely due to his UTI, possibly some dehydration.  - Gentle IV fluids  - avoid nephrotoxins   * Atrial fibrillation with RVR (HCC) - continue home meds, patient is almost rate controlled, we will use a dose of IV metoprolol now and then as needed in order to keep him rate controlled above and beyond his home  medications. - Per chart review and care everywhere The patient may have been on warfarin at some point, it is unclear whether he is still on warfarin at this time as family states that he may not have been taking several of his medications. -pt's INR was 10.67---6.29. It seems it has not been monitored as out pt  *  Acute encephalopathy - due  to his UTI, monitor for expected improvement with resolution of the same  * CAD (coronary artery disease) - continue home meds   * HTN (hypertension) - continue home meds   * Diabetes (HCC) - sliding scale insulin with corresponding glucose checks  CSW for d/c planning Will have Psych evaluation to see if he is competent to make his decisions. APS has been involved. Will need to assess for Guardianship if pt is deemed eligible   Case discussed with Care Management/Social Worker. Management plans discussed with the patient, family and they are in agreement.  CODE STATUS: full  DVT Prophylaxis: INR supratherapeutic  TOTAL TIME TAKING CARE OF THIS PATIENT: 25  minutes.  >50% time spent on counselling and coordination of care  POSSIBLE D/C IN 2-3 DAYS, DEPENDING ON CLINICAL CONDITION.  Note: This dictation was prepared with Dragon dictation along with smaller phrase technology. Any transcriptional errors that result from this process are unintentional.  Breindy Meadow M.D on 10/07/2016 at 8:01 PM  Between 7am to 6pm - Pager - [phone]  After 6pm go to [email or web address] - Social research officer, government  [facility]  Office  [phone]  CC: Primary care physician; [facility]

## 2016-10-07 NOTE — Progress Notes (Signed)
Pt arrived to room 244 via stretcher from the ER. Telemetry monitor applied and called to CCMD. Yellow socks placed. Oriented to room and call bell. Telesitter monitor in place.

## 2016-10-07 NOTE — Progress Notes (Signed)
MEDICATION RELATED CONSULT NOTE - INITIAL   Pharmacy Consult for electrolyte monitoring/replacement Indication: hypokalemia/hypomagnesemia  Allergies  Allergen Reactions  . Penicillins     Patient Measurements: Height:  (180.3 cm) Weight: 220 lb (99.8 kg) IBW/kg (Calculated) : 75.3  Vital Signs: Temp: 97.9 F (36.6 C) (04/24 0503) Temp Source: Oral (04/24 0503) BP: 115/58 (04/24 0855) Pulse Rate: 92 (04/24 0855) Intake/Output from previous day: 04/23 0701 - 04/24 0700 In: 986.3 [I.V.:236.3; IV Piggyback:750] Out: 200 [Urine:200] Intake/Output from this shift: Total I/O In: 160 [P.O.:160] Out: -   Labs:  Recent Labs  10/06/16 1951 10/07/16 0638  WBC 8.9 8.5  HGB 13.8 13.4  HCT 41.8 39.1*  PLT 216 210  CREATININE 1.66* 1.35*  MG  --  1.5*   Estimated Creatinine Clearance: 56 mL/min (A) (by C-G formula based on SCr of 1.35 mg/dL (H)).   Microbiology: No results found for this or any previous visit (from the past 720 hour(s)).  Medical History: Past Medical History:  Diagnosis Date  . CAD (coronary artery disease)   . Diabetes mellitus without complication (HCC)   . Hypertension   . Irregular heartbeat     Assessment: K = 2.7 Mg = 1.5  Goal of Therapy:  Electrolytes WNL  Plan:  Give KCl 40 mEq PO x 1 and 30 mEq IV x1 Give magnesium 4 g IV x 1 dose  Will recheck electrolytes at 1800 this evening.  Cindi Carbon, PharmD Clinical Pharmacist 10/07/2016,11:37 AM

## 2016-10-07 NOTE — Progress Notes (Signed)
Inpatient Diabetes Program Recommendations  AACE/ADA: New Consensus Statement on Inpatient Glycemic Control (2015)  Target Ranges:  Prepandial:   less than 140 mg/dL      Peak postprandial:   less than 180 mg/dL (1-2 hours)      Critically ill patients:  140 - 180 mg/dL   Results for NYHEEM, BINETTE (MRN [id number]) as of 10/07/2016 10:06  Ref. Range 10/06/2016 19:14 10/06/2016 23:01 10/07/2016 08:41  Glucose-Capillary Latest Ref Range: 65 - 99 mg/dL 782 (H) 956 (H) 213 (H)   Review of Glycemic Control  Diabetes history: DM2 Outpatient Diabetes medications: 70/30 BID (pt not sure of exact dose), Metformin 1000 mg BID Current orders for Inpatient glycemic control: Novolog 0-9 units TID with meals, Novolog 0-5 units QHS  Inpatient Diabetes Program Recommendations: Insulin - Basal: Please consider ordering Levemir 5 units Q24H. HgbA1C: Per Care Everywhere, A1C was 8.2% on 09/10/16 indicating an average glucose of 189 mg/dl.  NOTE: In reviewing chart, noted in Care Everywhere that patient seen Dr. Tawanna Cooler on 09/10/16 and had 70/30 20 units BID, Metformin 1000 mg BID, and Tradjenta 5 mg QAM listed as outpatient medication for DM control. Also noted patient had recently been discharged from SNF and he was receiving insulin while at SNF (per Dr. Nelida Meuse office note on 09/10/16).  Per office note on 09/10/16 by Dr. Tawanna Cooler  "Type 2 diabetes mellitus without complication, unspecified long term insulin use status (RAF-HCC): Was on insulin at SNF per records. Given his low functional status and relatively well controlled BG in 170-190s when checked by [facility] nurse won't prescribe insulin for now and watch BG."   Spoke with patient and he reports that he has a "nurse aid that helps me at home and they check my sugar and give me insulin."  Patient states that he takes a pill for diabetes BID as well but does not recall the name of it.   Thanks, Christopher Penner, RN, MSN, CDE Diabetes Coordinator Inpatient Diabetes  Program [phone] (Team Pager from 8am to 5pm)
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
## 2016-10-08 LAB — PROTIME-INR
INR: 2.55
PROTHROMBIN TIME: 27.9 s — AB (ref 11.4–15.2)

## 2016-10-08 LAB — BASIC METABOLIC PANEL
ANION GAP: 8 (ref 5–15)
BUN: 12 mg/dL (ref 6–20)
CALCIUM: 8.8 mg/dL — AB (ref 8.9–10.3)
CO2: 31 mmol/L (ref 22–32)
CREATININE: 1.24 mg/dL (ref 0.61–1.24)
Chloride: 99 mmol/L — ABNORMAL LOW (ref 101–111)
GFR, EST NON AFRICAN AMERICAN: 55 mL/min — AB (ref >60)
Glucose, Bld: 170 mg/dL — ABNORMAL HIGH (ref 65–99)
Potassium: 3.5 mmol/L (ref 3.5–5.1)
SODIUM: 138 mmol/L (ref 135–145)

## 2016-10-08 LAB — MAGNESIUM: MAGNESIUM: 2.1 mg/dL (ref 1.7–2.4)

## 2016-10-08 LAB — PHOSPHORUS: PHOSPHORUS: 3.1 mg/dL (ref 2.5–4.6)

## 2016-10-08 LAB — GLUCOSE, CAPILLARY
GLUCOSE-CAPILLARY: 170 mg/dL — AB (ref 65–99)
Glucose-Capillary: 189 mg/dL — ABNORMAL HIGH (ref 65–99)

## 2016-10-08 MED ORDER — CEPHALEXIN 500 MG PO CAPS: 500.0000 mg | ORAL_CAPSULE | Freq: Two times a day (BID) | ORAL | 0 refills | Status: AC

## 2016-10-08 MED ORDER — CEPHALEXIN 500 MG PO CAPS
500.0000 mg | ORAL_CAPSULE | Freq: Two times a day (BID) | ORAL | Status: DC
Start: 2016-10-08 — End: 2016-10-08
  Administered 2016-10-08: 500 mg via ORAL
  Filled 2016-10-08: qty 1

## 2016-10-08 MED ORDER — GABAPENTIN 300 MG PO CAPS: 300.0000 mg | ORAL_CAPSULE | Freq: Every day | ORAL | 0 refills | Status: AC

## 2016-10-08 MED ORDER — WARFARIN SODIUM 1 MG PO TABS: 3.0000 mg | ORAL_TABLET | Freq: Once | ORAL | Status: DC

## 2016-10-08 MED ORDER — WARFARIN SODIUM 3 MG PO TABS: 3.0000 mg | ORAL_TABLET | Freq: Every day | ORAL | 0 refills | Status: AC

## 2016-10-08 MED ORDER — WARFARIN - PHARMACIST DOSING INPATIENT: Freq: Every day | Status: DC

## 2016-10-08 NOTE — Progress Notes (Signed)
Patient's heart rate jumped to 150's with activity during PT session. Heart rate returned to baseline 100-110s after getting back in bed and resting a few minutes. Dr. Allena Katz notified, ok to proceed with discharge when CM/SW ready.

## 2016-10-08 NOTE — Progress Notes (Signed)
Patient given discharge teaching and paperwork regarding medications, diet, follow-up appointments and activity. Patient understanding verbalized. No complaints at this time. IV and telemetry discontinued prior to leaving. Skin assessment as previously charted and vitals are stable; on room air. Patient being discharged to home. Home health set up by Nann, CM- No further needs by Care Management. Prescriptions sent to pharmacy. Sister updated, girlfriend to make sure house is unlocked for EMS transport. EMS called.

## 2016-10-08 NOTE — Progress Notes (Signed)
MEDICATION RELATED CONSULT NOTE - INITIAL   Pharmacy Consult for electrolyte monitoring/replacement Indication: hypokalemia/hypomagnesemia  Allergies  Allergen Reactions  . Penicillins     Patient Measurements: Height:  (180.3 cm) Weight: 220 lb (99.8 kg) IBW/kg (Calculated) : 75.3  Vital Signs: Temp: 98.4 F (36.9 C) (04/25 0452) Temp Source: Oral (04/25 0452) BP: 103/43 (04/25 0452) Pulse Rate: 74 (04/25 0452) Intake/Output from previous day: 04/24 0701 - 04/25 0700 In: 684.2 [P.O.:280; I.V.:354.2; IV Piggyback:50] Out: 1530 [Urine:1530] Intake/Output from this shift: No intake/output data recorded.  Labs:  Recent Labs  10/06/16 1951 10/07/16 0638 10/07/16 1835 10/08/16 0450  WBC 8.9 8.5  --   --   HGB 13.8 13.4  --   --   HCT 41.8 39.1*  --   --   PLT 216 210  --   --   CREATININE 1.66* 1.35*  --  1.24  MG  --  1.5* 2.4 2.1  PHOS  --   --  2.3* 3.1   Estimated Creatinine Clearance: 61 mL/min (by C-G formula based on SCr of 1.24 mg/dL).   Microbiology: No results found for this or any previous visit (from the past 720 hour(s)).  Medical History: Past Medical History:  Diagnosis Date  . CAD (coronary artery disease)   . Diabetes mellitus without complication (HCC)   . Hypertension   . Irregular heartbeat     Assessment: K = 3.5 Phos = 3.1 Mg = 2.1  Goal of Therapy:  Electrolytes WNL  Plan:  Electrolytes WNL. No replacement needed at this time.   Will recheck electrolytes with am labs  Gardner Candle, PharmD, BCPS Clinical Pharmacist 10/08/2016 7:16 AM
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
## 2016-10-08 NOTE — Care Management (Signed)
Informed that psych had stated patient has capacity to make his decisions.  He declines skilled nursing placement. Spoke with patient's sister Vista Mink.  She says she was unaware that patient was receiving any home health services "I never saw anybody."  Chip Boer states agency made many attempts to speak with Rayfield Citizen and left messages which were never returned. Informed Rayfield Citizen that [facility]'s phone number will be on the discharge information.   Rayfield Citizen says her car is too small and patient needs to transport patient home.  Discussed in depth that this would not be covered by insurance.  Also relayed that someone must be in the home to receive patient.  Rayfield Citizen will obtain the house keys and be at the residence.  Informed patient transport will not be covered by insurance and of discharge home with resumption of home health through [facility].  APS will continue to follow in the home

## 2016-10-08 NOTE — Plan of Care (Signed)
Problem: Education: Goal: Knowledge of treatment and prevention of UTI/Pyleonephritis will improve Outcome: Not Progressing Patient is lethargic, answer questions appropriately. Patient does not show evidence of retaining information.

## 2016-10-08 NOTE — Evaluation (Signed)
Physical Therapy Evaluation Patient Details Name: Christopher Alvarado MRN: [id number] DOB: July 24, 1940 Today's Date: 10/08/2016   History of Present Illness  Pt is a 76 y/o M who presented with increased confusion and found to have a UTI, AKI, and was in a-fib with RVR.  Per chart review, pt with Alzheimer's dementia.  Pt's PMH includes cardiac surgery and stent placement.    Clinical Impression  Pt admitted with above diagnosis. Pt currently with functional limitations due to the deficits listed below (see PT Problem List). Christopher Alvarado lives alone but reports his girlfriend is at home with him the majority of the time. Pt ambulating with RW PTA and denies any falls in the past 6 months.  Unclear on pt's level of independence with ADLs and IADLs as he is a poor historian.  Pt with poor safety awareness and h/o dementia, making him a high fall risk.  He currently requires min guard assist to ambulate in hallway which was discontinued due to pt's HR elevating to 158.  Pt will benefit from skilled PT to increase their independence and safety with mobility to allow discharge to the venue listed below.      Follow Up Recommendations Supervision/Assistance - 24 hour;SNF    Equipment Recommendations  None recommended by PT    Recommendations for Other Services       Precautions / Restrictions Precautions Precautions: Fall;Other (comment) Precaution Comments: monitor HR Restrictions Weight Bearing Restrictions: No      Mobility  Bed Mobility Overal bed mobility: Independent             General bed mobility comments: No physical assist or cues needed.    Transfers Overall transfer level: Needs assistance Equipment used: Rolling walker (2 wheeled) Transfers: Sit to/from Stand Sit to Stand: Min guard         General transfer comment: Pt requires cues for safe technique using RW and for proper hand placement.    Ambulation/Gait Ambulation/Gait assistance: Min guard Ambulation  Distance (Feet): 90 Feet Assistive device: Rolling walker (2 wheeled) Gait Pattern/deviations: Step-through pattern;Decreased stride length;Trunk flexed Gait velocity: decreased Gait velocity interpretation: Below normal speed for age/gender General Gait Details: Flexed posture but pt steady using RW.  HR initially 110-118 but elevates to 158 when ambulating.  RN notified immediately.  Stairs            Wheelchair Mobility    Modified Rankin (Stroke Patients Only)       Balance Overall balance assessment: Needs assistance Sitting-balance support: No upper extremity supported;Feet supported Sitting balance-Leahy Scale: Normal     Standing balance support: No upper extremity supported;During functional activity Standing balance-Leahy Scale: Fair Standing balance comment: Pt able to stand statically without UE support but requires RW for dynamic activities.                             Pertinent Vitals/Pain Pain Assessment: No/denies pain    Home Living Family/patient expects to be discharged to:: Private residence Living Arrangements: Alone (although girlfriend there "most of the time") Available Help at Discharge: Available PRN/intermittently;Other (Comment) (girlfriend) Type of Home: House Home Access: Level entry     Home Layout: One level Home Equipment: Walker - 2 wheels;Wheelchair - manual;Tub bench      Prior Function Level of Independence: Needs assistance   Gait / Transfers Assistance Needed: Pt ambulating household distances with RW and denies any falls in the past 6 months.  ADL's / Homemaking Assistance Needed: Unsure of pt's ability to perform ADLs and IADLs as information from sisters contradicting pt.  Pt reports he is able to bathe, dress ind but his girlfriend sometimes sets out clothes for the pt.          Hand Dominance        Extremity/Trunk Assessment   Upper Extremity Assessment Upper Extremity Assessment:  (Strength  grossly 4/5 BUEs)    Lower Extremity Assessment Lower Extremity Assessment:  (Strength grossly 4-/5 BLEs)    Cervical / Trunk Assessment Cervical / Trunk Assessment: Kyphotic  Communication   Communication: No difficulties  Cognition Arousal/Alertness: Awake/alert Behavior During Therapy: Agitated;WFL for tasks assessed/performed Overall Cognitive Status: History of cognitive impairments - at baseline                                 General Comments: H/o mild dementia and pt with poor safety awareness as he is refusing recommendation for SNF.  He is pleasant with this therapist for the majority of the session but sporatically appears agitated saying, "I don't want anybody who is going to prevent me from going home!"      General Comments General comments (skin integrity, edema, etc.): Pt found soiled in bed, nurse tech notified.      Exercises     Assessment/Plan    PT Assessment Patient needs continued PT services  PT Problem List Decreased strength;Decreased activity tolerance;Decreased balance;Decreased safety awareness;Cardiopulmonary status limiting activity       PT Treatment Interventions DME instruction;Gait training;Stair training;Functional mobility training;Therapeutic activities;Therapeutic exercise;Balance training;Patient/family education    PT Goals (Current goals can be found in the Care Plan section)  Acute Rehab PT Goals Patient Stated Goal: to go home PT Goal Formulation: With patient Time For Goal Achievement: 10/22/16 Potential to Achieve Goals: Good    Frequency Min 2X/week   Barriers to discharge Decreased caregiver support Pt lives alone and has h/o dementia with poor safety awareness    Co-evaluation               End of Session Equipment Utilized During Treatment: Gait belt Activity Tolerance: Treatment limited secondary to medical complications (Comment) (HR up to 158 when ambulating, down to 116 at rest) Patient left:  in bed;with call bell/phone within reach;with bed alarm set;with nursing/sitter in room;Other (comment) (with nurse tech in room) Nurse Communication: Mobility status;Other (comment) (HR) PT Visit Diagnosis: Unsteadiness on feet (R26.81);Muscle weakness (generalized) (M62.81)    Time: 0981-1914 PT Time Calculation (min) (ACUTE ONLY): 22 min   Charges:   PT Evaluation $PT Eval Low Complexity: 1 Procedure     PT G Codes:        Encarnacion Chu PT, DPT 10/08/2016, 1:02 PM

## 2016-10-08 NOTE — Care Management (Signed)
CM informed that ems is at patient's home and unable to get into the house and or "should not be left alone- he can not take care of himself."  CM relayed that patient has been deemed to have capacity and he refused skilled nursing placement, APS and home health is following.  CM called sister  Rayfield Citizen who was suppose to make sure the residence was unlocked.  She told CM that patient has told his friend Sheran Lawless- "not to give the key to anyone."  CM contacted Sheran Lawless and ended up speaking to one of her family members because patient is very HOH.  Informed that patient's neighbor across the hall from his apartment has a key.  CM left vm for [employer] with [facility] DSS APS and updated

## 2016-10-08 NOTE — Discharge Summary (Signed)
[facility] [place] [facility]   PATIENT NAME: Neale Marzette    MR#:  [id number]  DATE OF BIRTH:  Jan 19, 1941  DATE OF ADMISSION:  10/06/2016 ADMITTING PHYSICIAN: Oralia Manis, MD  DATE OF DISCHARGE: 10/08/2016  PRIMARY CARE PHYSICIAN: [facility]    ADMISSION DIAGNOSIS:  Confusion [R41.0] AKI (acute kidney injury) (HCC) [N17.9] Urinary tract infection without hematuria, site unspecified [N39.0] Atrial fibrillation, unspecified type (HCC) [I48.91] Alzheimer's dementia with behavioral disturbance, unspecified timing of dementia onset [G30.9, F02.81]  DISCHARGE DIAGNOSIS:  Acute encephalopathy-improved UTI  SECONDARY DIAGNOSIS:   Past Medical History:  Diagnosis Date  . CAD (coronary artery disease)   . Diabetes mellitus without complication (HCC)   . Hypertension   . Irregular heartbeat     HOSPITAL COURSE:   RichardCrawfordis a 76 y.o.malewho presents with Increased confusion. Patient is unable to contribute much to his own history. Family stated that they found the patient in his home, confused and with everything in a mess. He was brought here to the ED where he was found to have a urinary tract infection, AKI, and was in A. fib with RVR  * UTI (urinary tract infection) - IV antibiotics, urine culture shows GNR--change to po keflex  *AKI (acute kidney injury) (HCC) - likely due to his UTI, possibly some dehydration.  -Gentle IV fluids  Creat back to normal - avoid nephrotoxins  *Atrial fibrillation with RVR (HCC) - continue home meds, patient is almost rate controlled --pt's INR was 10.67---6.29--2.5. -pt resumed warfarin again  *Acute encephalopathy - due to his UTI -improved  *CAD (coronary artery disease) - continue home meds  *HTN (hypertension) - continue home meds  *Diabetes (HCC) - sliding scale insulin  Resumed metformin.  CSW for d/c planning  Pt has rehab bed but he  declines it. Per psych eval pt able to make his decision. Pt wants to go home. His sisters are in the room Resume [facility] services D/c home  APS is involved with pt's case  Case discussed with Care Management/Social Worker. Management plans discussed with the patient, family and they are in agreement.  CODE STATUS: full  DVT Prophylaxis: on coumadin  CONSULTS OBTAINED:  Treatment Team:  Audery Amel, MD  DRUG ALLERGIES:   Allergies  Allergen Reactions  . Penicillins     DISCHARGE MEDICATIONS:   Current Discharge Medication List    START taking these medications   Details  cephALEXin (KEFLEX) 500 MG capsule Take 1 capsule (500 mg total) by mouth every 12 (twelve) hours. Qty: 6 capsule, Refills: 0      CONTINUE these medications which have CHANGED   Details  gabapentin (NEURONTIN) 300 MG capsule Place 1 capsule (300 mg total) into feeding tube at bedtime. Qty: 30 capsule, Refills: 0    warfarin (COUMADIN) 3 MG tablet Place 1 tablet (3 mg total) into feeding tube daily at 6 PM. Qty: 30 tablet, Refills: 0      CONTINUE these medications which have NOT CHANGED   Details  acetaminophen (ACETAMINOPHEN 8 HOUR) 650 MG CR tablet Take 650 mg by mouth every 8 (eight) hours as needed for pain.    atorvastatin (LIPITOR) 80 MG tablet Take 80 mg by mouth daily.    carvedilol (COREG) 25 MG tablet Take 37.5 mg by mouth 2 (two) times daily with a meal.     fluticasone (FLONASE) 50 MCG/ACT nasal spray Place 1 spray into both nostrils daily.    furosemide (LASIX)  40 MG tablet Place 40 mg into feeding tube daily.     levETIRAcetam (KEPPRA) 750 MG tablet Take 750 mg by mouth 2 (two) times daily.    linagliptin (TRADJENTA) 5 MG TABS tablet Place 5 mg into feeding tube daily.    losartan (COZAAR) 25 MG tablet Take 25 mg by mouth daily.    magnesium oxide (MAG-OX) 400 MG tablet Place 800 mg into feeding tube 2 (two) times daily.    metFORMIN (GLUCOPHAGE) 1000 MG tablet Place  1,000 mg into feeding tube 2 (two) times daily with a meal.     potassium chloride SA (K-DUR,KLOR-CON) 20 MEQ tablet Take 20 mEq by mouth 2 (two) times daily.    QUEtiapine (SEROQUEL) 25 MG tablet Place 12.5 mg into feeding tube at bedtime as needed.    traZODone (DESYREL) 50 MG tablet Place 25 mg into feeding tube at bedtime as needed for sleep.    aspirin 81 MG tablet Take 81 mg by mouth daily.        If you experience worsening of your admission symptoms, develop shortness of breath, life threatening emergency, suicidal or homicidal thoughts you must seek medical attention immediately by calling 911 or calling your MD immediately  if symptoms less severe.  You Must read complete instructions/literature along with all the possible adverse reactions/side effects for all the Medicines you take and that have been prescribed to you. Take any new Medicines after you have completely understood and accept all the possible adverse reactions/side effects.   Please note  You were cared for by a hospitalist during your hospital stay. If you have any questions about your discharge medications or the care you received while you were in the hospital after you are discharged, you can call the unit and asked to speak with the hospitalist on call if the hospitalist that took care of you is not available. Once you are discharged, your primary care physician will handle any further medical issues. Please note that NO REFILLS for any discharge medications will be authorized once you are discharged, as it is imperative that you return to your primary care physician (or establish a relationship with a primary care physician if you do not have one) for your aftercare needs so that they can reassess your need for medications and monitor your lab values. Today   SUBJECTIVE  I want to go home doc!   VITAL SIGNS:  Blood pressure 105/60, pulse (!) 105, temperature 98.4 F (36.9 C), temperature source Oral, resp.  rate 18, height  (1.803 m), weight 99.8 kg (220 lb), SpO2 97 %.  I/O:   Intake/Output Summary (Last 24 hours) at 10/08/16 1129 Last data filed at 10/08/16 0900  Gross per 24 hour  Intake           644.17 ml  Output             1380 ml  Net          -735.83 ml    PHYSICAL EXAMINATION:  GENERAL:  76 y.o.-year-old patient lying in the bed with no acute distress.  EYES: Pupils equal, round, reactive to light and accommodation. No scleral icterus. Extraocular muscles intact.  HEENT: Head atraumatic, normocephalic. Oropharynx and nasopharynx clear.  NECK:  Supple, no jugular venous distention. No thyroid enlargement, no tenderness.  LUNGS: Normal breath sounds bilaterally, no wheezing, rales,rhonchi or crepitation. No use of accessory muscles of respiration.  CARDIOVASCULAR: S1, S2 normal. No murmurs, rubs, or gallops.  ABDOMEN: Soft,  non-tender, non-distended. Bowel sounds present. No organomegaly or mass. PEG + EXTREMITIES: No pedal edema, cyanosis, or clubbing.  NEUROLOGIC: Cranial nerves II through XII are intact. Muscle strength 5/5 in all extremities. Sensation intact. Gait not checked.  PSYCHIATRIC: The patient is alert  SKIN: No obvious rash, lesion, or ulcer.   DATA REVIEW:   CBC   Recent Labs Lab 10/07/16 0638  WBC 8.5  HGB 13.4  HCT 39.1*  PLT 210    Chemistries   Recent Labs Lab 10/08/16 0450  NA 138  K 3.5  CL 99*  CO2 31  GLUCOSE 170*  BUN 12  CREATININE 1.24  CALCIUM 8.8*  MG 2.1    Microbiology Results   Recent Results (from the past 240 hour(s))  Urine culture     Status: Abnormal (Preliminary result)   Collection Time: 10/06/16  7:51 PM  Result Value Ref Range Status   Specimen Description URINE, RANDOM  Final   Special Requests NONE  Final   Culture >=100,000 COLONIES/mL GRAM NEGATIVE RODS (A)  Final   Report Status PENDING  Incomplete    RADIOLOGY:  No results found.   Management plans discussed with the patient, family and  they are in agreement.  CODE STATUS:     Code Status Orders        Start     Ordered   10/06/16 2301  Full code  Continuous     10/06/16 2300    Code Status History    Date Active Date Inactive Code Status Order ID Comments User Context   This patient has a current code status but no historical code status.      TOTAL TIME TAKING CARE OF THIS PATIENT: *40* minutes.    Dayn Barich M.D on 10/08/2016 at 11:29 AM  Between 7am to 6pm - Pager - [phone] After 6pm go to [email or web address] - Social research officer, government  [facility]  Office  [phone]  CC: Primary care physician; [facility]

## 2016-10-08 NOTE — Progress Notes (Signed)
ANTICOAGULATION CONSULT NOTE - Initial Consult  Pharmacy Consult for warfarin dosing Indication: atrial fibrillation  Allergies  Allergen Reactions  . Penicillins     Patient Measurements: Height:  (180.3 cm) Weight: 220 lb (99.8 kg) IBW/kg (Calculated) : 75.3  Vital Signs: Temp: 98.4 F (36.9 C) (04/25 0452) Temp Source: Oral (04/25 0452) BP: 103/43 (04/25 0452) Pulse Rate: 74 (04/25 0452)  Labs:  Recent Labs  10/06/16 1951 10/06/16 2059 10/07/16 0638 10/08/16 0450  HGB 13.8  --  13.4  --   HCT 41.8  --  39.1*  --   PLT 216  --  210  --   LABPROT  --  87.7* 57.5* 27.9*  INR  --  10.67* 6.29* 2.55  CREATININE 1.66*  --  1.35* 1.24  TROPONINI  --  <0.03  --   --     Estimated Creatinine Clearance: 61 mL/min (by C-G formula based on SCr of 1.24 mg/dL).   Medical History: Past Medical History:  Diagnosis Date  . CAD (coronary artery disease)   . Diabetes mellitus without complication (HCC)   . Hypertension   . Irregular heartbeat     Medications:  Scheduled:  . aspirin EC  81 mg Oral Daily  . atorvastatin  80 mg Oral Daily  . carvedilol  12.5 mg Oral BID WC  . fluticasone  1 spray Each Nare Daily  . gabapentin  300 mg Oral Daily  . insulin aspart  0-5 Units Subcutaneous QHS  . insulin aspart  0-9 Units Subcutaneous TID WC  . levETIRAcetam  750 mg Oral BID  . linagliptin  5 mg Oral Daily  . magnesium oxide  800 mg Oral BID  . sodium chloride flush  3 mL Intravenous Q12H    Assessment: Patient admitted w/ AMS and found to have an INR of 10.6 on admit.  Per [facility] at [facility] patient takes warfarin  daily w/ evening meal - will follow-up on anticoagulation once INR < 3.0  Dosing history: Date INR Dose 4/23 10.7 HELD 2.5 mg vitamin K PO given 4/24 6.29 HOLD 4/25  2.55     Warfarin    Goal of Therapy:  INR 2-3 Monitor platelets by anticoagulation protocol: Yes   Plan:  INR now therapeutic. Patient still receiving antibiotics.   Will order warfarin  PO x 1 dose tonight.  Will recheck INR with AM labs tomorrow  Gardner Candle, PharmD, BCPS Clinical Pharmacist 10/08/2016

## 2016-10-08 NOTE — Clinical Social Work Note (Signed)
CSW received referral for SNF.  Case discussed with case manager and plan is to discharge home with home health.  CSW to sign off please re-consult if social work needs arise.  CSW updated APS worker Irving Shows [phone] that patient is refusing to go to SNF.  Ervin Knack. Doyne Ellinger, MSW, [employer] [phone]

## 2016-10-10 LAB — URINE CULTURE

## 2016-11-14 DEATH — deceased

## 2017-02-20 IMAGING — CR DG CHEST 1V
1 series · 1 of 1 positions shown · non-contrast
Comparison: 09/02/2015

CLINICAL DATA: Pt to ED via EMS, pts neighbor called 911 for
confusion. Pt disoriented to situation, unable to report hx. Per
chart pt h/o diabetes, htn, irregular heart beat, cardiac stent.
Cough noted in ED.

EXAM:
CHEST 1 VIEW

[chest ap]
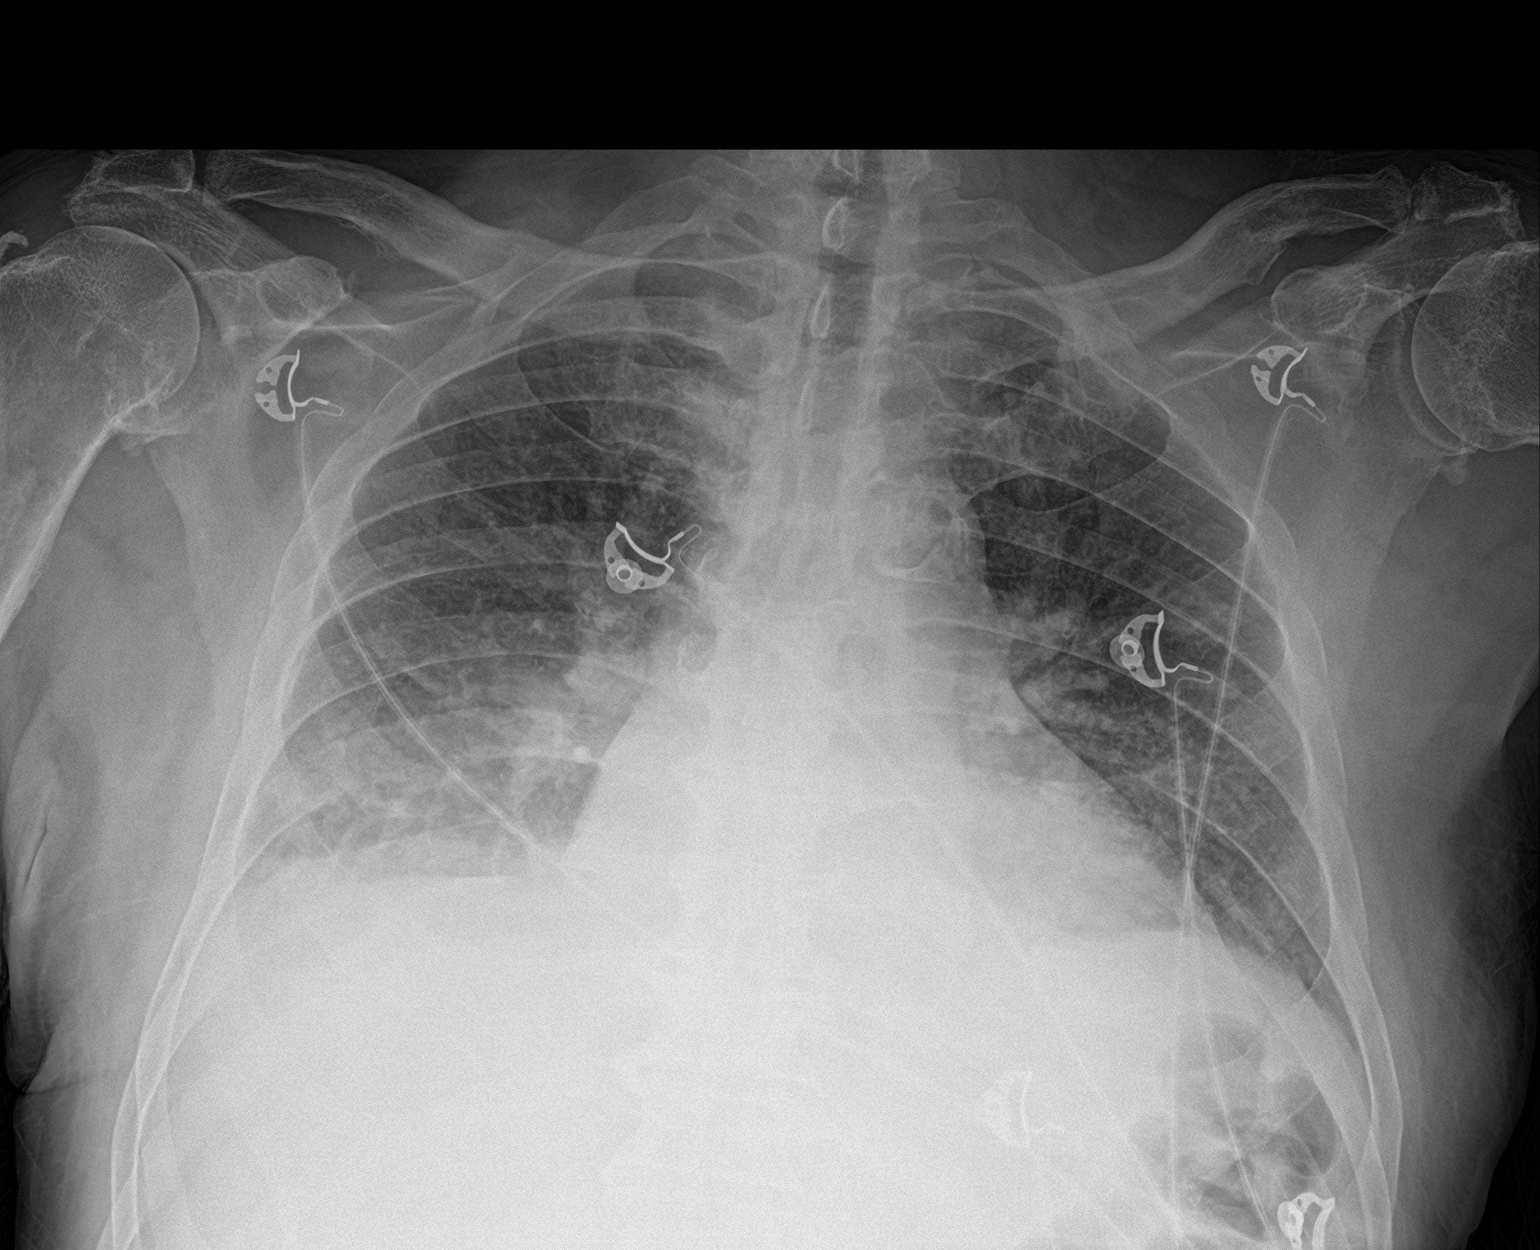

[1 of 1 positions shown; findings below may reference images not displayed]

FINDINGS: Normal cardiac silhouette. There is fine airspace disease in LEFT
and RIGHT lower lobe. No pneumothorax.
IMPRESSION: Bibasilar airspace disease greater on the RIGHT is concerning for
multifocal pneumonia versus pulmonary edema.
